# Patient Record
Sex: Male | Born: 1990
Health system: Southern US, Community
[De-identification: ages and names within clinical notes are randomized; demographics above are authoritative.]

## PROBLEM LIST (undated history)

## (undated) DIAGNOSIS — R569 Unspecified convulsions: Secondary | ICD-10-CM

## (undated) MED FILL — PHENYTOIN SODIUM EXTENDED 100 MG CAPSULE: 100 100 mg | ORAL | 30 days supply | Qty: 150 | Fill #4 | Status: CN

## (undated) MED FILL — LACOSAMIDE 150 MG TABLET: 150 150 mg | ORAL | 30 days supply | Qty: 60 | Fill #0 | Status: CN

## (undated) MED FILL — LACOSAMIDE 100 MG TABLET: 100 100 mg | ORAL | Qty: 120 | Fill #0 | Status: CN

## (undated) MED FILL — LACOSAMIDE 150 MG TABLET: 150 150 mg | ORAL | 90 days supply | Qty: 180 | Fill #0 | Status: CN

## (undated) MED FILL — LACOSAMIDE 150 MG TABLET: 150 150 mg | ORAL | 90 days supply | Qty: 180 | Fill #1 | Status: CN

---
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
## 2011-05-09 ENCOUNTER — Encounter: Payer: Self-pay | Admitting: *Deleted

## 2011-05-09 ENCOUNTER — Emergency Department (HOSPITAL_COMMUNITY): Payer: 59

## 2011-05-09 ENCOUNTER — Emergency Department (HOSPITAL_COMMUNITY)
Admission: EM | Admit: 2011-05-09 | Discharge: 2011-05-09 | Disposition: A | Discharge status: Home / Self Care | Payer: 59 | Attending: Emergency Medicine | Admitting: Emergency Medicine

## 2011-05-09 DIAGNOSIS — R509 Fever, unspecified: Secondary | ICD-10-CM | POA: Insufficient documentation

## 2011-05-09 DIAGNOSIS — R112 Nausea with vomiting, unspecified: Secondary | ICD-10-CM | POA: Insufficient documentation

## 2011-05-09 DIAGNOSIS — R05 Cough: Secondary | ICD-10-CM | POA: Insufficient documentation

## 2011-05-09 DIAGNOSIS — R059 Cough, unspecified: Secondary | ICD-10-CM | POA: Insufficient documentation

## 2011-05-09 DIAGNOSIS — R5381 Other malaise: Secondary | ICD-10-CM | POA: Insufficient documentation

## 2011-05-09 DIAGNOSIS — IMO0001 Reserved for inherently not codable concepts without codable children: Secondary | ICD-10-CM | POA: Insufficient documentation

## 2011-05-09 DIAGNOSIS — R070 Pain in throat: Secondary | ICD-10-CM | POA: Insufficient documentation

## 2011-05-09 HISTORY — DX: Unspecified convulsions: R56.9

## 2011-05-09 LAB — URINALYSIS, ROUTINE W REFLEX MICROSCOPIC
Glucose, UA: NEGATIVE mg/dL
Protein, ur: 30 mg/dL — AB
Specific Gravity, Urine: 1.027 (ref 1.005–1.030)
pH: 7 (ref 5.0–8.0)

## 2011-05-09 LAB — URINE MICROSCOPIC-ADD ON

## 2011-05-09 LAB — CBC
Hemoglobin: 14.5 g/dL (ref 13.0–17.0)
MCH: 30.8 pg (ref 26.0–34.0)
RBC: 4.71 MIL/uL (ref 4.22–5.81)

## 2011-05-09 LAB — MONONUCLEOSIS SCREEN: Mono Screen: NEGATIVE

## 2011-05-09 LAB — DIFFERENTIAL
Eosinophils Absolute: 0 10*3/uL (ref 0.0–0.7)
Lymphocytes Relative: 10 % — ABNORMAL LOW (ref 12–46)
Lymphs Abs: 1.2 10*3/uL (ref 0.7–4.0)
Monocytes Relative: 4 % (ref 3–12)
Neutrophils Relative %: 86 % — ABNORMAL HIGH (ref 43–77)

## 2011-05-09 LAB — POCT I-STAT, CHEM 8
BUN: 16 mg/dL (ref 6–23)
Creatinine, Ser: 1 mg/dL (ref 0.50–1.35)
Glucose, Bld: 100 mg/dL — ABNORMAL HIGH (ref 70–99)
Hemoglobin: 13.3 g/dL (ref 13.0–17.0)
TCO2: 24 mmol/L (ref 0–100)

## 2011-05-09 MED ORDER — ONDANSETRON HCL 4 MG/2ML IJ SOLN
4.0000 mg | Freq: Once | INTRAMUSCULAR | Status: AC
  Administered 2011-05-09: 4 mg via INTRAVENOUS
  Filled 2011-05-09: qty 2

## 2011-05-09 MED ORDER — SODIUM CHLORIDE 0.9 % IV BOLUS (SEPSIS)
1000.0000 mL | Freq: Once | INTRAVENOUS | Status: AC
  Administered 2011-05-09: 1000 mL via INTRAVENOUS

## 2011-05-09 NOTE — ED Notes (Signed)
Patient presents to ed via [facility] states he came home from college last pm was c/o sorethroat with fever and chills, Mother called the MD and was told to give patient Motrin 800 mg patient went to derm. MD today was feeling worsel, went to PEDs office and was having chills and shaking. Strep was doen at MD office and was neg.

## 2011-05-09 NOTE — ED Provider Notes (Signed)
History     CSN: [id number] Arrival date & time: 05/09/2011 12:37 PM   First MD Initiated Contact with Patient 05/09/11 1242      Chief Complaint  Patient presents with  . Chills    (Consider location/radiation/quality/duration/timing/severity/associated sxs/prior treatment) Patient is a 20 y.o. male presenting with fever. The history is provided by the patient.  Fever Primary symptoms of the febrile illness include fever, fatigue, cough, nausea, vomiting and myalgias. Primary symptoms do not include shortness of breath, diarrhea or rash. The current episode started yesterday. This is a new problem. The problem has been gradually worsening.    Past Medical History  Diagnosis Date  . Seizures     History reviewed. No pertinent past surgical history.  History reviewed. No pertinent family history.  History  Substance Use Topics  . Smoking status: Never Smoker   . Smokeless tobacco: Not on file  . Alcohol Use: No      Review of Systems  Constitutional: Positive for fever and fatigue. Negative for chills.  HENT: Positive for sore throat.   Eyes: Negative.   Respiratory: Positive for cough. Negative for shortness of breath.   Cardiovascular: Negative.   Gastrointestinal: Positive for nausea and vomiting. Negative for diarrhea.  Musculoskeletal: Positive for myalgias.  Skin: Negative.  Negative for rash.  Neurological: Negative.     Allergies  Amoxicillin  Home Medications   Current Outpatient Rx  Name Route Sig Dispense Refill  . DIVALPROEX SODIUM 500 MG PO TB24 Oral Take 500 mg by mouth daily.      Marland Kitchen DIVALPROEX SODIUM 125 MG PO CPSP Oral Take 125 mg by mouth 2 (two) times daily.      Marland Kitchen LAMOTRIGINE 25 MG PO TABS Oral Take 25 mg by mouth daily.        BP 110/66  Pulse 114  Temp(Src) 98.2 F (36.8 C) (Oral)  Resp 16  SpO2 100%  Physical Exam  Constitutional: He appears well-developed and well-nourished.  HENT:  Head: Normocephalic.  Right Ear:  External ear normal.  Left Ear: External ear normal.  Mouth/Throat: No uvula swelling. Posterior oropharyngeal erythema present. No posterior oropharyngeal edema or tonsillar abscesses.  Neck: Normal range of motion. Neck supple.  Cardiovascular: Normal rate and regular rhythm.   Pulmonary/Chest: Effort normal and breath sounds normal.  Abdominal: Soft. Bowel sounds are normal. There is no splenomegaly. There is no tenderness. There is no rebound and no guarding.  Musculoskeletal: Normal range of motion.  Lymphadenopathy:       Head (right side): No occipital adenopathy present.       Head (left side): No occipital adenopathy present.    He has no cervical adenopathy.  Neurological: He is alert. No cranial nerve deficit.  Skin: Skin is warm and dry. No rash noted.  Psychiatric: He has a normal mood and affect.    ED Course  Procedures (including critical care time)   Labs Reviewed  CBC  DIFFERENTIAL  I-STAT, CHEM 8  URINALYSIS, ROUTINE W REFLEX MICROSCOPIC  MONONUCLEOSIS SCREEN   No results found.   No diagnosis found.    MDM  Discussed lab/x-ray studies with patient and family, as well as value of LP. Decided against LP - patient feeling improved but symptoms not completely resolved. Drinking PO fluids. Will discharge home and encourage follow up prior to returning to school on Monday.        Rodena Medin, PA 05/09/11 1537

## 2011-05-09 NOTE — ED Provider Notes (Addendum)
Medical screening examination/treatment/procedure(s) were conducted as a shared visit with non-physician practitioner(s) and myself.  I personally evaluated the patient during the encounter  20 y/o male, home from college, developed body aches, fever, sore throat yesterday.  +exposure to mono.  At derm office today developed lightheadedness, nausea, shaking chills, rigors. Patient awake during shaking and recalls entire episode.  Not consistent with known seizure disorder. Referred here.  Feeling much better now and states just "weak".  No headache, vision change, cough, chest pain, abdominal pain.   TMs clear, oropharynx erythematous, no asymmetry (strep neg at office), CTAB, RRR. No cervical LAD, no meningismus.  Normal neuro exam. Tolerating PO and ambulatory in ED.  Likelihood of viral infection discussed with patient and family.  Possibility of viral meningitis discussed extensively with patient and parents.  Offered LP for confirmation but would not change management.  Patient looks much too well for bacterial meningitis.  Patient and parents declined LP.  Will continue supportive care for viral syndrome.  Followup at [facility] this weekend discussed as well as signs and symptoms that should prompt return to ED.  Glynn Octave, MD 05/09/11 1944  Glynn Octave, MD 05/09/11 1945

## 2011-12-05 ENCOUNTER — Other Ambulatory Visit: Payer: Self-pay

## 2011-12-05 ENCOUNTER — Telehealth: Payer: Self-pay

## 2011-12-05 DIAGNOSIS — K625 Hemorrhage of anus and rectum: Secondary | ICD-10-CM

## 2011-12-05 NOTE — Telephone Encounter (Signed)
Todd Young, Can you call Todd Young (Todd Young's wife). Their son, Todd Young who is 59 had some rectal bleeding and needs a flex sig this week (leaving for trip next Monday). Add him on to Thursday at [facility] (moderate sedation) or Friday lunch case at [facility] (moderate sedation), whichever works best for them. Thanks Her phone number is [phone]   Pt's mother has been notified and meds reviewed she was given the prep instructions and the phone number to Dr Christella Hartigan office for any questions.

## 2011-12-06 ENCOUNTER — Encounter (HOSPITAL_COMMUNITY)
Admission: RE | Disposition: A | Discharge status: Home / Self Care | Payer: Self-pay | Source: Ambulatory Visit | Attending: Gastroenterology

## 2011-12-06 ENCOUNTER — Ambulatory Visit (HOSPITAL_COMMUNITY)
Admission: RE | Admit: 2011-12-06 | Discharge: 2011-12-06 | Disposition: A | Discharge status: Home / Self Care | Payer: 59 | Source: Ambulatory Visit | Attending: Gastroenterology | Admitting: Gastroenterology

## 2011-12-06 ENCOUNTER — Encounter (HOSPITAL_COMMUNITY): Payer: Self-pay

## 2011-12-06 DIAGNOSIS — K625 Hemorrhage of anus and rectum: Secondary | ICD-10-CM

## 2011-12-06 DIAGNOSIS — K644 Residual hemorrhoidal skin tags: Secondary | ICD-10-CM | POA: Insufficient documentation

## 2011-12-06 DIAGNOSIS — K649 Unspecified hemorrhoids: Secondary | ICD-10-CM

## 2011-12-06 HISTORY — PX: FLEXIBLE SIGMOIDOSCOPY: SHX5431

## 2011-12-06 SURGERY — SIGMOIDOSCOPY, FLEXIBLE
Anesthesia: Moderate Sedation

## 2011-12-06 MED ORDER — MIDAZOLAM HCL 10 MG/2ML IJ SOLN
INTRAMUSCULAR | Status: AC
  Filled 2011-12-06: qty 2

## 2011-12-06 MED ORDER — FENTANYL CITRATE 0.05 MG/ML IJ SOLN
INTRAMUSCULAR | Status: AC
  Filled 2011-12-06: qty 2

## 2011-12-06 MED ORDER — SODIUM CHLORIDE 0.9 % IV SOLN
INTRAVENOUS | Status: DC
  Administered 2011-12-06: 500 mL via INTRAVENOUS

## 2011-12-06 NOTE — Op Note (Signed)
[facility] [address] [place], [place]  [address]  FLEXIBLE SIGMOIDOSCOPY PROCEDURE REPORT  PATIENT:  Todd, Young  MR#:  [id number] BIRTHDATE:  [birth year], 20 yrs. old  GENDER:  male ENDOSCOPIST:  Rachael Fee, MD PROCEDURE DATE:  12/06/2011 PROCEDURE:  Flexible Sigmoidoscopy, diagnostic ASA CLASS:  Class II INDICATIONS:  intermittent rectal bleeding MEDICATIONS:   none  DESCRIPTION OF PROCEDURE:   After the risks benefits and alternatives of the procedure were thoroughly explained, informed consent was obtained.  Digital rectal exam was performed and revealed no rectal masses.   The EC - 3890Li ([id number]) endoscope was introduced through the anus and advanced to the splenic flexure, without limitations.  The quality of the prep was good. The instrument was then slowly withdrawn as the mucosa was fully examined. <<PROCEDUREIMAGES>> Small external hemorrhoids were found.  The examination was otherwise normal (see image1, image4, and image5).   Retroflexed views in the rectum revealed no abnormalities.    The scope was then withdrawn from the patient and the procedure terminated.COMPLICATIONS:  None  ENDOSCOPIC IMPRESSION: 1) Small external hemorrhoids 2) Otherwise normal examination (no cancers, polyps or inflamation)  RECOMMENDATIONS: Try fiber supplement (once daily citrucel orange powder). Use OTC preparation H for intermittent bleeding or any anal discomforts.  ______________________________ Rachael Fee, MD  n. eSIGNED:   Rachael Fee at 12/06/2011 01:24 PM  Thornton Papas, [phone]

## 2011-12-06 NOTE — H&P (Signed)
  HPI: This is a very pleasant young man with intermittent rectal bleeding for past few months.  NO real anal discomfort, no signficant constipation or diarrhea    Past Medical History  Diagnosis Date  . Seizures     History reviewed. No pertinent past surgical history.  No current facility-administered medications for this encounter.    Allergies as of 12/05/2011 - Review Complete 05/09/2011  Allergen Reaction Noted  . Amoxicillin Other (See Comments) 05/09/2011    History reviewed. No pertinent family history.  History   Social History  . Marital Status: Single    Spouse Name: N/A    Number of Children: N/A  . Years of Education: N/A   Occupational History  . Not on file.   Social History Main Topics  . Smoking status: Never Smoker   . Smokeless tobacco: Not on file  . Alcohol Use: No  . Drug Use: No  . Sexually Active:    Other Topics Concern  . Not on file   Social History Narrative  . No narrative on file      Physical Exam: There were no vitals taken for this visit. Constitutional: generally well-appearing Psychiatric: alert and oriented x3 Abdomen: soft, nontender, nondistended, no obvious ascites, no peritoneal signs, normal bowel sounds     Assessment and plan: 21 y.o. male with intermittent rectal bleeding  For flex sig today

## 2011-12-06 NOTE — Discharge Instructions (Signed)

## 2011-12-07 ENCOUNTER — Encounter (HOSPITAL_COMMUNITY): Payer: Self-pay | Admitting: Gastroenterology

## 2012-01-03 ENCOUNTER — Other Ambulatory Visit (HOSPITAL_COMMUNITY): Payer: Self-pay | Admitting: Pediatrics

## 2012-01-03 DIAGNOSIS — R569 Unspecified convulsions: Secondary | ICD-10-CM

## 2012-01-17 ENCOUNTER — Ambulatory Visit (HOSPITAL_COMMUNITY)
Admission: RE | Admit: 2012-01-17 | Discharge: 2012-01-17 | Disposition: A | Discharge status: Home / Self Care | Payer: 59 | Source: Ambulatory Visit | Attending: Pediatrics | Admitting: Pediatrics

## 2012-01-17 DIAGNOSIS — R569 Unspecified convulsions: Secondary | ICD-10-CM | POA: Insufficient documentation

## 2012-01-17 DIAGNOSIS — Z1389 Encounter for screening for other disorder: Secondary | ICD-10-CM | POA: Insufficient documentation

## 2012-01-17 NOTE — Progress Notes (Signed)
EEG COMPLETED

## 2012-01-19 NOTE — Procedures (Signed)
EEG NUMBER:  [id number].  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  There was no loss of consciousness, unresponsiveness or other seizure activity (345.10, 345.00, 781.0)  PROCEDURE:  The tracing was carried out on a 32 channel digital Cadwell recorder, reformatted into 16 channel montages with 1 devoted to EKG. The patient was awake and asleep during the recording.  The international 10/20 system lead placement was used.  He takes Lamictal and Depakote.  RECORDING TIME:  22 minutes.  DESCRIPTION OF FINDINGS:  Dominant frequency is a 10 Hz, 50 microvolt activity that is well regulated.  Background activity consists of predominantly alpha and beta range activity.  The patient becomes drowsy with 20 microvolt theta and upper delta range activity and drifts into natural sleep with generalized delta range activity vertex sharp waves and symmetric sleep spindles.  Activating procedures with photic stimulation induced driving response between 6 and 18 Hz.  Hyperventilation was unchanged.  EKG showed regular sinus rhythm with ventricular response of 72 beats per minute.  IMPRESSION:  This is a normal record with the patient awake, drowsy, and asleep.     Deanna Artis. Sharene Skeans, M.D.    [id number]:OZHY D:  01/19/2012 07:45:25  T:  01/19/2012 07:55:16  Job #:  [id number]

## 2012-09-30 ENCOUNTER — Telehealth: Payer: Self-pay | Admitting: Family

## 2012-09-30 NOTE — Telephone Encounter (Signed)
9 one half minute  phone call with mother.  I strongly suspect that it is the difficulty the courses and not the patient to have changed.  That being said I can't rule out the effects of Depakote, but there isn't a good alternative.  A suggested that we consider a detailed psychometric testing called [facility] with Dr. Nicole Cella call .  She will contact him and let me know whether we are going to proceed.

## 2012-09-30 NOTE — Telephone Encounter (Signed)
Mom [name] is concerned about Todd Young. She said that he has difficult classes in college and has a Engineer, technical sales but that he is really struggling with memory and retention of material. He has problems with that and attention in general. Mom wonders if Depakote causing cognitive blunting. He is working with a Engineer, technical sales. He understands things when working with a Engineer, technical sales and understands things but doesn't remember things after he leaves the tutor. Mom wonders is it the Depakote? Is there another type of specialist that he should talk to about this or something else he should do? She wonders if it is a side effect of Depakote or problems that Todd Young is having. She would like to talk to Dr Sharene Skeans and asked to be called back at [phone].

## 2012-10-10 ENCOUNTER — Telehealth: Payer: Self-pay | Admitting: *Deleted

## 2012-10-10 NOTE — Telephone Encounter (Deleted)
[name] called back stating she was at work and would try again at 2pm when she is on break.

## 2012-10-10 NOTE — Telephone Encounter (Addendum)
Angel left voicemail stating two nights ago Todd Young was having the "eye thing" again.  He was laying the dark and could see the light in the hall.  This made him so dizzy he would not lay with his eyes closed therefore he had trouble going to sleep.  The next day (yesterday) he said he felt out of it all day.  He also stated he had 3 episodes of what he described as  hypoglycemia. He was very shaky.  He did not eat breakfast but did eat lunch and again after that.  She can be reached at [phone] before 7pm.   I spoke with mother for about 5 minutes and the patient for about 20 minutes.  I think that these may represent some form of migraine variant. I don't think he is having hypoglycemia but I told him that if he would keep snacks that were low in sugar and higher in protein and fat that it might work better for him.  He can certainly be tested for reactive hypoglycemia.  He had his episodes of feeling shaky the day after his oscillatory movements of the eyes and at a time when he was not feeling well.  For the first time he describes a feeling of vertigo when he closed his eyes.  I told him I would be happy to see him after he finishes finals.

## 2012-10-24 ENCOUNTER — Telehealth: Payer: Self-pay | Admitting: Family

## 2012-10-24 NOTE — Telephone Encounter (Signed)
Mom Todd Young called. She really feels that he needs something for anxiety. She thinks that it is interfering with his ability at school. She wants to know if an anxiety medication would interfere with his seizure control or his seizure medication. Mom wants to talk to you about it. She said that she had talked to Todd Young and says that Todd Young is going in to see him but Mom wants to know if an antianxiety medication will cause problems with his seizures. Mom knows that you are reluctant for him to take anxiety medication but she feels strongly that he needs something. Mom wants you to call her to discuss it. Mom's number is [phone]. TG

## 2012-10-27 NOTE — Telephone Encounter (Signed)
Spoke with mother I agree that he may be anxious.  Please find a time when we might be able to spend a half an hour to discuss his condition.  He is back at school for 2 semesters this summer.

## 2012-10-29 NOTE — Telephone Encounter (Signed)
I spoke with patient's mom and we have scheduled the patient to see Dr. Sharene Skeans on 11/20/12 @ 12:00 pm arriving at 11:45 am. MB

## 2012-11-05 DIAGNOSIS — R259 Unspecified abnormal involuntary movements: Secondary | ICD-10-CM | POA: Insufficient documentation

## 2012-11-05 DIAGNOSIS — H5310 Unspecified subjective visual disturbances: Secondary | ICD-10-CM

## 2012-11-05 DIAGNOSIS — Z79899 Other long term (current) drug therapy: Secondary | ICD-10-CM

## 2012-11-05 DIAGNOSIS — R209 Unspecified disturbances of skin sensation: Secondary | ICD-10-CM

## 2012-11-05 DIAGNOSIS — R42 Dizziness and giddiness: Secondary | ICD-10-CM | POA: Insufficient documentation

## 2012-11-05 DIAGNOSIS — G40309 Generalized idiopathic epilepsy and epileptic syndromes, not intractable, without status epilepticus: Secondary | ICD-10-CM | POA: Insufficient documentation

## 2012-11-05 DIAGNOSIS — G40209 Localization-related (focal) (partial) symptomatic epilepsy and epileptic syndromes with complex partial seizures, not intractable, without status epilepticus: Secondary | ICD-10-CM | POA: Insufficient documentation

## 2012-11-11 ENCOUNTER — Other Ambulatory Visit: Payer: Self-pay

## 2012-11-11 DIAGNOSIS — G40309 Generalized idiopathic epilepsy and epileptic syndromes, not intractable, without status epilepticus: Secondary | ICD-10-CM

## 2012-11-11 DIAGNOSIS — G40209 Localization-related (focal) (partial) symptomatic epilepsy and epileptic syndromes with complex partial seizures, not intractable, without status epilepticus: Secondary | ICD-10-CM

## 2012-11-11 MED ORDER — DEPAKOTE ER 500 MG PO TB24: ORAL_TABLET | ORAL | Status: DC

## 2012-11-11 NOTE — Telephone Encounter (Signed)
Pharmacy needs updated Rx for use of copay reduction card.

## 2012-11-20 ENCOUNTER — Ambulatory Visit: Payer: Self-pay | Admitting: Pediatrics

## 2013-01-08 ENCOUNTER — Telehealth: Payer: Self-pay | Admitting: Family

## 2013-01-08 NOTE — Telephone Encounter (Signed)
I spoke with Todd Young and she said that Aug. 4th at 4:00 pm arriving at 3:50 works great. MB

## 2013-01-08 NOTE — Telephone Encounter (Signed)
My nighttime commitment to [facility] medical [facility] quality committee has been canceled.  We just opened a 4 PM on August 4.  We will schedule Todd Young at that time.  I informed Marcelino Duster to contact the family and offer this slot.

## 2013-01-08 NOTE — Telephone Encounter (Signed)
Mom, Todd Young, called and said that Todd Young ended up withdrawing from second session of summer school. He is having lots of problems and is really struggling. They are going to [place] tomorrow and coming back Aug 2nd. Mom asked if you could you see him any time between Aug 3rd and Aug 14th. . Right now you have no open appointments week of Aug 4-8 and you are out of the office week of Aug 11th. TG

## 2013-01-19 ENCOUNTER — Ambulatory Visit (INDEPENDENT_AMBULATORY_CARE_PROVIDER_SITE_OTHER): Payer: 59 | Admitting: Pediatrics

## 2013-01-19 ENCOUNTER — Encounter: Payer: Self-pay | Admitting: Pediatrics

## 2013-01-19 VITALS — BP 110/64 | HR 84 | Ht 73.5 in | Wt 180.6 lb

## 2013-01-19 DIAGNOSIS — G40309 Generalized idiopathic epilepsy and epileptic syndromes, not intractable, without status epilepticus: Secondary | ICD-10-CM

## 2013-01-19 DIAGNOSIS — H5310 Unspecified subjective visual disturbances: Secondary | ICD-10-CM

## 2013-01-19 DIAGNOSIS — R259 Unspecified abnormal involuntary movements: Secondary | ICD-10-CM

## 2013-01-19 NOTE — Progress Notes (Signed)
Patient: Todd Young MRN: [id number] Sex: male DOB: [birth year]  Provider: Deetta Perla, MD Location of Care: [facility]  Note type: Routine return visit  History of Present Illness: Referral Source: Dr. Rosanne Ashing History from: patient and [facility] chart Chief Complaint: Struggling in School/Anxiety Issues  Todd Young is a 22 y.o. male referred for evaluation of epilepsy.  He returns January 19, 2013, for the first time since June 02, 2012.  He has a long history of absence and generalized tonic-clonic seizures.  At one point, it appeared that he had complex partial seizures.  As best I can determine, he is not experiencing seizures at this time.  He has episodes were he senses the world oscillating back and forth without any alterations in awareness of his world or loss of his ability to function.  He is not experiencing vertigo during this time.  His father has observed this behavior and has not seen nystagmus or any other eye movements.  The episodes last for seconds to 15 minutes.  He believes that they occur at least once a week, which makes them more frequent than they ever have been.  He has had some episodes of fasciculations in his muscles including his quadriceps, hamstrings, biceps, and neck.  In all likelihood this is a benign condition.  His main concern is that he is not performing in school as well as he has in the past.  He is in a premed program and received a B minus in organic chemistry and a B in analytic chemistry despite working very hard.  I asked if he sought the assistance of tutors and he said "yeah I caved in."  This happened too late in the term to make a difference.  I talked to him about the utility of tutors in terms of helping him understand concepts that are difficult and directing his study.  I see this as a way to improve his performance in school and not as a sign of weakness.  He has modifications at school, which would allow him  50% more time to take tests.  He also has a Engineer, technical sales in the learning center at [employer] that can help him with study techniques and unfortunately he did not take advantage of that until late in the school year.  He is extremely stressed by school.  He has very high expectations for himself and I am certain that he worries about disappointing his parents as well as himself.  He thinks the stress propels him to study hard, but he has enough insight to realize that his anxiety may inhibit his ability to study well.  He had IQ testing a number of years ago, which established a superior IQ with processing speed index in the average range.  He particularly had difficulty completing mental tasks under time pressure.  He again did very well on his achievement tests with the exception of tests that were done under time pressure.  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  Concerns were raised about an anxiety disorder.  Testing was carried out in November and early December 2010.  The central question today is whether or not his anxiety is significant enough that he should be placed on anxiolytics.  Review of Systems: 12 system review was remarkable for anxiety  Past Medical History  Diagnosis Date  . Seizures    Hospitalizations:  no, Head Injury: no, Nervous System Infections: no, Immunizations up to date: yes Past Medical History Comments:   He had onset of seizures when he was 14. These were absence. Keppra worked well and brought the episodes under control. Initially, he had problems with temper, the Keppra well controlled his seizures and it was continued until his doses were escalated to 3000 mg a day at which time Lamictal was started.  He had an EEG in October 2009 that showed bifrontal sharp waves. He an MRI scan in January 2010 that was normal without with contrast.  Patient was seen  in the ER Dept. at [facility] for high fever in Nov. 2012.  He started to have generalized tonic-clonic seizures and other episodes associated with unresponsive staring that suggested complex partial seizures.  I discontinued Keppra and continued Lamictal monotherapy. Unfortunately, he had issues with cognition and continued seizures.  Finally I tapered Lamictal and started Depakote, and he has done extremely well on low dose Lamictal and average doses of Depakote.  He has experienced episodes of sensing the world oscillating back and forth without any other alterations in awareness of the world or his ability to function.  This typically happens when he has been looking in a computer for a long time or a movie.   These have been evaluated in detail, and I can find no evidence of seizure activity.  Unfortunately, they are typically infrequent enough that we have not been able to study them with a prolonged video EEG that would be definitive.  I am unaware of any seizure-like behavior of it that could cause this form of oscillopsia.  I think it is more likely that this represents a migraine variant.  Even with that, I have no personal experience with that symptom as a manifestation of migraine variant.  When the patient was in [place], [place] in the summer of 2013, he had an episode of paraesthesia in his right arm that began in the forearm and spread to his fingers.  He was unable to shake his arm to reestablish normal sensation.  Shortly after that began, he had twitching of his thumb and index finger that lasted intermittently over 10 minutes.  This did not persist, although the numbness did persist for about an hour.  The patient had been sleep deprived as I expected would occur because of meals that began at 10 p.m. and nights that often lasted until 2 or 3 in the morning.  I strongly encouraged him to get enough sleep, although again I suspected that the prolonged nature of his sensory symptoms  made this more likely that he had a migraine variant than a seizure.  In early September 2013 and again April 16, 2012, the patient had irregular twitching of his middle finger.  He was able to capture this on video, which I reviewed.  I was absolutely certain that this did not represent a focal motor seizure and conveyed that both to his mother and to [place].  He had an episode of high fever and shaking chills of unknown etiology.  He recovered spontaneously.  Birth History 8 lbs. infant born at [redacted] weeks gestational age to a 21 year old primigravida Gestation was uncomplicated Mother received  normal spontaneous vaginal delivery Nursery Course was uncomplicated Growth and Development was recalled as  normal  Behavior History none  Surgical History Past Surgical History  Procedure Laterality Date  . Wisdom tooth extraction  2011    x4   . Flexible sigmoidoscopy  12/06/2011    Procedure: FLEXIBLE SIGMOIDOSCOPY;  Surgeon: Rachael Fee, MD;  Location: [facility];  Service: Endoscopy;  Laterality: N/A;   Family History family history is not on file.  Father had episodes of confusion following exercise that were evaluated at [facility], workup was negative.  There is a strong family history of migraine with aura in father, paternal aunt, paternal grandmother, paternal first cousin, and his sister.  Family History is negative migraines, seizures, cognitive impairment, blindness, deafness, birth defects, chromosomal disorder, autism.  Social History History   Social History  . Marital Status: Single    Spouse Name: N/A    Number of Children: N/A  . Years of Education: N/A   Social History Main Topics  . Smoking status: Never Smoker   . Smokeless tobacco: Never Used  . Alcohol Use: No  . Drug Use: No  . Sexually Active: None   Other Topics Concern  . None   Social History Narrative  . None   Educational level university School Attending: [employer].  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  Current Outpatient Prescriptions on File Prior to Visit  Medication Sig Dispense Refill  . DEPAKOTE ER 500 MG 24 hr tablet Take 1 tab by mouth twice daily  180 tablet  0  . divalproex (DEPAKOTE SPRINKLE) 125 MG capsule Take 125 mg by mouth 2 (two) times daily.       Marland Kitchen lamoTRIgine (LAMICTAL) 25 MG tablet Take 25 mg by mouth daily.        Marland Kitchen acetaminophen (TYLENOL) 500 MG tablet Take 500 mg by mouth every 6 (six) hours as needed. Fever/pain       . ibuprofen (ADVIL,MOTRIN) 200 MG tablet Take 400 mg by mouth every 6 (six) hours as needed. Fever/pain       . ISOtretinoin (ACCUTANE) 30 MG capsule Take 30 mg by mouth 2 (two) times daily.         No current facility-administered medications on file prior to visit.   The medication list was reviewed and reconciled. All changes or newly prescribed medications were explained.  A complete medication list was provided to the patient/caregiver.  Allergies  Allergen Reactions  . Amoxicillin Other (See Comments)    Mouth inflamed and itchy    Physical Exam BP 110/64  Pulse 84  Ht 6' 1.5" (1.867 m)  Wt 180 lb 9.6 oz (81.92 kg)  BMI 23.5 kg/m2  General: alert, well developed, well nourished young man, in no acute distress, left-handed, sandy hair, blue eyes Head: normocephalic, no dysmorphic features Ears, Nose and Throat: Otoscopic: tympanic membranes normal .  Pharynx: oropharynx is pink without exudates or tonsillar hypertrophy. Neck: supple, full range of motion, no cranial or cervical bruits Respiratory: auscultation clear Cardiovascular: no murmurs, pulses are normal Musculoskeletal: no skeletal deformities or apparent scoliosis Skin: no rashes or neurocutaneous lesions Trunk:  no deformities of his  limbs  Neurologic Exam  Mental Status: alert; oriented to person, place, and year; knowledge is normal for age; language is normal Cranial Nerves: visual fields are full to double simultaneous stimuli; extraocular movements are full and conjugate; pupils are round reactive to light; funduscopic examination shows sharp disc margins with normal vessels; symmetric facial strength; midline tongue and uvula; hearing is normal and symmetric Motor: Normal  strength, tone, and mass; good fine motor movements; no pronator drift. Sensory: intact responses to touch and temperature Coordination: good finger-to-nose, rapid repetitive alternating movements and finger apposition   Gait and Station: normal gait and station; patient is able to walk on heels, toes and tandem without difficulty; balance is adequate; Romberg exam is negative; Gower response is negative Reflexes: symmetric and diminished bilaterally; no clonus; bilateral flexor plantar responses.  Assessment 1. 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 2. Generalized convulsive epilepsy, well controlled. 3. Subjective visual disturbance 368.10.  I am uncertain what this is. 4. He has not experienced any abnormal involuntary movements or altered sensation in some time.  Discussion Todd Young may very well have a general anxiety disorder.  He places stress on himself to perform and consequently is quite upset when he does not perform to the best of his ability.  I talked with him about reaching out to the supports that he has at school early in the semester and trying to stay up with his studies and not stretch deadlines.  This cost him this summer when he did not complete some problems sets on time and received low grades for those despite the fact he did extremely well on his tests and would have received an A otherwise.  I also suggested that he focus on trying to master the concepts he is learning in school, because if he does that, I  think the grades will take care of themselves.  Again, I strongly urged him to reach out to tutors teaching assistant that are readily available to him, but he has avoided more out of a feeling that he is giving in and should be able to deal with his academic issues without assistance.  Plan I am going to withhold the use of an anxiolytic medication until we see how the school year starts.  I have had him seen to evaluate his visual disturbance.  The neuro-ophthalmologist did not have ideas for further workup, let alone on treatment.  I appreciate the opportunity to participate in his care.  He will return in four months for reassessment.  I will see him sooner if we need to consider placing him on additional medication.  I would like to avoid that, because I do not want to affect his cognitive function with additional medicine that affects his nervous system.  Nonetheless, if his anxiety proves to be something that inhibits learning, this will be necessary.  Deetta Perla MD

## 2013-01-19 NOTE — Patient Instructions (Signed)
It was good to see you.  Let see how the semester begins.  Please don't failed to reach out for help if you needed either from me or from your teachers.let me know if you need refills and your prescriptions.  Extremities are imaging time to get adequate sleep as many days as she can each week.  Repair for your side effects to master them rather than to get a grade, and I think things will go better for you.  I'll see you in December or sooner if needed.

## 2013-01-29 ENCOUNTER — Other Ambulatory Visit: Payer: Self-pay | Admitting: Family

## 2013-01-29 DIAGNOSIS — G40209 Localization-related (focal) (partial) symptomatic epilepsy and epileptic syndromes with complex partial seizures, not intractable, without status epilepticus: Secondary | ICD-10-CM

## 2013-01-29 DIAGNOSIS — G40309 Generalized idiopathic epilepsy and epileptic syndromes, not intractable, without status epilepticus: Secondary | ICD-10-CM

## 2013-04-15 ENCOUNTER — Other Ambulatory Visit: Payer: Self-pay | Admitting: Family

## 2013-06-01 ENCOUNTER — Ambulatory Visit (INDEPENDENT_AMBULATORY_CARE_PROVIDER_SITE_OTHER): Payer: 59 | Admitting: Pediatrics

## 2013-06-01 ENCOUNTER — Encounter: Payer: Self-pay | Admitting: Pediatrics

## 2013-06-01 VITALS — BP 100/60 | HR 84 | Ht 73.75 in | Wt 183.0 lb

## 2013-06-01 DIAGNOSIS — G40309 Generalized idiopathic epilepsy and epileptic syndromes, not intractable, without status epilepticus: Secondary | ICD-10-CM

## 2013-06-01 DIAGNOSIS — H5310 Unspecified subjective visual disturbances: Secondary | ICD-10-CM

## 2013-06-01 NOTE — Progress Notes (Signed)
Patient: Todd Young MRN: [id number] Sex: male DOB: [birth year]  Provider: Deetta Perla, MD Location of Care: [facility]  Note type: Routine return visit  History of Present Illness: Referral Source: Dr. Rosanne Ashing History from: patient and [facility] chart Chief Complaint: Epilepsy  Todd Young is a 22 y.o. male who returns for evaluation and management of epilepsy.  He returns on June 01, 2013 for the first time since January 19, 2013.  He has a well-controlled seizure disorder that includes absence and generalized tonic-clonic seizures.  At one point it appeared that he also had complex partial seizures.  The patient continues to have infrequent episodes of the sensation that the world was oscillating back and forth without alterations in awareness of his world or loss of his ability to function.  This typically occurs when he has been sleep-deprived or is under stress.  The episodes have not been frequent or prolonged.  He is in his junior year at [employer].  He had a pretty good term.  The only course that was of concern to him was B minus in genetics.  He is getting out more, spends sometime everyday working out, and he was taking breaks from his studies.  He wants to go to medical school.  He is beginning to think about taking some time off before applying to medical school.  I think that for his mental health that that might actually be a good plan.  The average age of the entering classes is getting older as students pursue other activities before they attend medical school.  He has been so anxious about his school performance in what he expects of himself and what he thinks his parents expect of him that this has the potential to become overwhelming.  We did not talk about using anxiolytics today.  He seems to be in a better place emotionally and mentally, which I was happy to see.  His overall health has been good.  He is taking and tolerating his  antiepileptic medications without side effects.  Review of Systems: 12 system review was unremarkable  Past Medical History  Diagnosis Date  . Seizures    Hospitalizations: no, Head Injury: no, Nervous System Infections: no, Immunizations up to date: yes Past Medical History Comments: He had onset of seizures when he was 14. These were absence. Keppra worked well and brought the episodes under control. Initially, he had problems with temper, the Keppra well controlled his seizures and it was continued until his doses were escalated to 3000 mg a day at which time Lamictal was started.   He had an EEG in October 2009 that showed bifrontal sharp waves. He an MRI scan in January 2010 that was normal without with contrast.   Patient was seen in the ER Dept. at [facility] for high fever in Nov. 2012.   He started to have generalized tonic-clonic seizures and other episodes associated with unresponsive staring that suggested complex partial seizures.   I discontinued Keppra and continued Lamictal monotherapy. Unfortunately, he had issues with cognition and continued seizures.   Finally I tapered Lamictal and started Depakote, and he has done extremely well on low dose Lamictal and average doses of Depakote.   He has experienced episodes of sensing the world oscillating back and forth without any other alterations in awareness of the world or his ability to function. This typically happens when he has been looking in a computer for a long time or a  movie.   These have been evaluated in detail, and I can find no evidence of seizure activity. Unfortunately, they are typically infrequent enough that we have not been able to study them with a prolonged video EEG that would be definitive. I am unaware of any seizure-like behavior of it that could cause this form of oscillopsia. I think it is more likely that this represents a migraine variant. Even with that, I have no personal experience with that  symptom as a manifestation of migraine variant.   When the patient was in [place], [place] in the summer of 2013, he had an episode of paraesthesia in his right arm that began in the forearm and spread to his fingers. He was unable to shake his arm to reestablish normal sensation. Shortly after that began, he had twitching of his thumb and index finger that lasted intermittently over 10 minutes. This did not persist, although the numbness did persist for about an hour.   The patient had been sleep deprived as I expected would occur because of meals that began at 10 p.m. and nights that often lasted until 2 or 3 in the morning. I strongly encouraged him to get enough sleep, although again I suspected that the prolonged nature of his sensory symptoms made this more likely that he had a migraine variant than a seizure.   In early September 2013 and again April 16, 2012, the patient had irregular twitching of his middle finger. He was able to capture this on video, which I reviewed. I was absolutely certain that this did not represent a focal motor seizure and conveyed that both to his mother and to [place].   He had an episode of high fever and shaking chills of unknown etiology. He recovered spontaneously.  Birth History 8 lbs. infant born at [redacted] weeks gestational age to a 21 year old primigravida  Gestation was uncomplicated  Mother received normal spontaneous vaginal delivery  Nursery Course was uncomplicated  Growth and Development was recalled as normal  Behavior History anxiety  Surgical History Past Surgical History  Procedure Laterality Date  . Wisdom tooth extraction  2011    x4   . Flexible sigmoidoscopy  12/06/2011    Procedure: FLEXIBLE SIGMOIDOSCOPY;  Surgeon: Rachael Fee, MD;  Location: [facility];  Service: Endoscopy;  Laterality: N/A;    Family History family history is not on file. Family History is negative migraines, seizures, cognitive impairment, blindness,  deafness, birth defects, chromosomal disorder, autism.  Social History History   Social History  . Marital Status: Single    Spouse Name: N/A    Number of Children: N/A  . Years of Education: N/A   Social History Main Topics  . Smoking status: Never Smoker   . Smokeless tobacco: Never Used  . Alcohol Use: Yes     Comment: Patient very rarely drinks alcohol.  . Drug Use: No  . Sexual Activity: No   Other Topics Concern  . None   Social History Narrative  . 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  Current Outpatient Prescriptions on File Prior to Visit  Medication Sig Dispense Refill  . acetaminophen (TYLENOL) 500 MG tablet Take 500 mg by mouth every 6 (six) hours as needed. Fever/pain       . DEPAKOTE ER  500 MG 24 hr tablet TAKE 1 TABLET BY MOUTH TWICE DAILY  180 tablet  5  . divalproex (DEPAKOTE SPRINKLE) 125 MG capsule Take 125 mg by mouth 2 (two) times daily.       Marland Kitchen ibuprofen (ADVIL,MOTRIN) 200 MG tablet Take 400 mg by mouth every 6 (six) hours as needed. Fever/pain       . LAMICTAL 25 MG tablet TAKE 1 TABLET BY MOUTH TWICE DAILY  186 tablet  5   No current facility-administered medications on file prior to visit.   The medication list was reviewed and reconciled. All changes or newly prescribed medications were explained.  A complete medication list was provided to the patient/caregiver.  Allergies  Allergen Reactions  . Amoxicillin Other (See Comments)    Mouth inflamed and itchy    Physical Exam BP 100/60  Pulse 84  Ht 6' 1.75" (1.873 m)  Wt 183 lb (83.008 kg)  BMI 23.66 kg/m2  General: alert, well developed, well nourished young man, in no acute distress, left-handed, brown hair, blue eyes  Head: normocephalic, no dysmorphic features   Ears, Nose and Throat: Otoscopic: tympanic membranes normal . Pharynx: oropharynx is pink without exudates or tonsillar hypertrophy.  Neck: supple, full range of motion, no cranial or cervical bruits  Respiratory: auscultation clear  Cardiovascular: no murmurs, pulses are normal  Musculoskeletal: no skeletal deformities or apparent scoliosis  Skin: no rashes or neurocutaneous lesions  Trunk: no deformities of his limbs  Neurologic Exam   Mental Status: alert; oriented to person, place, and year; knowledge is normal for age; language is normal  Cranial Nerves: visual fields are full to double simultaneous stimuli; extraocular movements are full and conjugate; pupils are round reactive to light; funduscopic examination shows sharp disc margins with normal vessels; symmetric facial strength; midline tongue and uvula; hearing is normal and symmetric  Motor: Normal strength, tone, and mass; good fine motor movements; no pronator drift.  Sensory: intact responses to touch and temperature  Coordination: good finger-to-nose, rapid repetitive alternating movements and finger apposition  Gait and Station: normal gait and station; patient is able to walk on heels, toes and tandem without difficulty; balance is adequate; Romberg exam is negative; Gower response is negative  Reflexes: symmetric and diminished bilaterally; no clonus; bilateral flexor plantar responses.  Assessment 1. Generalized nonconvulsive epilepsy, well controlled (345.00). 2. Generalized convulsive epilepsy, well controlled (345.10). 3. Subjective visual disturbance (368.10).  Discussion As mentioned above I am very pleased with Todd Young's behaviors, which I think are very healthy and have contributed to a better balance in his life.  Plan  I have no plans to change his medications at this time.  I will refill them when they run out.  I will plan to see him in six months' time, but of course will see him sooner if he has recurrent  seizures or new neurological problems.  I spent 30 minutes of face-to-face time with him, some of that was in consultation concerning his future both short-term and long-term.  Deetta Perla MD

## 2013-06-16 ENCOUNTER — Telehealth: Payer: Self-pay | Admitting: Family

## 2013-06-16 DIAGNOSIS — G40909 Epilepsy, unspecified, not intractable, without status epilepticus: Secondary | ICD-10-CM

## 2013-06-16 NOTE — Telephone Encounter (Signed)
I received a call from Dr. Kevan Ny, Veronda Prude PCP, at 6:40 PM, regarding him having abdominal and back pain with increased lipase to 287 with the possibility of pancreatitis. He is going to have abdominal CT as an outpatient for further evaluation. He is on Depakote with a dose of 625 mg twice a day as well as lamotrigine 25 mg twice a day. I recommend to add amylase as well as Depakote level to his labs and since he is symptomatic and the lipase is significantly elevated, I recommend to replace Depakote with Keppra 1 g twice a day starting from tonight. He was on Keppra before with good seizure control although it was discontinued due to temper issues.  I discussed with Dr. Kevan Ny that other options such as Topamax and zonisamide may have the same side effect of pancreatitis, in addition they have to be titrated up gradually but we are able to start Keppra with higher dose to replace Depakote. Dr. Kevan Ny is going to send a prescription for Keppra. I will call patient tomorrow to see how he is doing.

## 2013-06-16 NOTE — Telephone Encounter (Signed)
Todd Young left a message saying that he was seen by a doctor today and dx with pleurisy. He was given injections of Toradol and Depomedrol, and given Rx for Prednisone taper. He called to ask Todd Young if the medications would interfere with his seizure medications. I called Todd Young back and told him that the medications would not interact. He had no further questions.TG

## 2013-06-17 ENCOUNTER — Ambulatory Visit
Admission: RE | Admit: 2013-06-17 | Discharge: 2013-06-17 | Disposition: A | Discharge status: Home / Self Care | Payer: 59 | Source: Ambulatory Visit | Attending: Internal Medicine | Admitting: Internal Medicine

## 2013-06-17 ENCOUNTER — Other Ambulatory Visit: Payer: Self-pay | Admitting: Internal Medicine

## 2013-06-17 DIAGNOSIS — K859 Acute pancreatitis without necrosis or infection, unspecified: Secondary | ICD-10-CM

## 2013-06-17 DIAGNOSIS — R109 Unspecified abdominal pain: Secondary | ICD-10-CM

## 2013-06-17 MED ORDER — IOHEXOL 300 MG/ML  SOLN
100.0000 mL | Freq: Once | INTRAMUSCULAR | Status: AC | PRN
  Administered 2013-06-17: 100 mL via INTRAVENOUS

## 2013-06-17 NOTE — Telephone Encounter (Signed)
I received a call last night at 8 PM from mother that Keppra was eventually not controlling his seizure so she would like to start another medication. I discussed with Dr. Sharene Skeans who is his primary neurologist and decided to start him on phenytoin at this point. I called in the prescription to start him on 300 mg of phenytoin by mouth at the beginning and 100 mg 3 times a day from the next morning. I discussed the side effects of phenytoin with his father and recommend to check a blood level on Friday or Monday morning. He is going to follow with his internist PCP, Dr. Kevan Ny for further evaluation and management of pancreatitis and is in process of doing abdominal CT. Tammy please call patient this afternoon to see how he is doing and to schedule for a trough level of phenytoin Friday morning or Monday morning.

## 2013-06-17 NOTE — Telephone Encounter (Signed)
Tried calling "[name], and reached a message that said his VMB has not been set up yet, then it hung up. I called the home number and reached mom and dad. She said that Todd Young is doing fine this morning and has not had any seizures. Mom will bring pt to get trough level drawn Friday 06/19/13 at [facility] on [address]. I will fax orders when they are ready.  Pt is scheduled to see Dr. Rexene Edison in our office Monday 06/22/13.  Dr.Nab, Please enter the lab orders and let me know when they are ready to be sent. Thanks, [employer]

## 2013-06-17 NOTE — Telephone Encounter (Signed)
Phenytoin level was ordered.

## 2013-06-17 NOTE — Addendum Note (Signed)
Addended byKeturah Shavers on: 06/17/2013 07:37 PM   Modules accepted: Orders

## 2013-06-19 NOTE — Telephone Encounter (Signed)
Faxed orders

## 2013-06-19 NOTE — Telephone Encounter (Signed)
I called and talked to mother and father. Todd Young is doing better, a lipase which was increased to 350 came back to 280. He has had no seizure activity although he is slightly drowsy. He had his Dilantin level this morning although they do not need no the result. I told parents that if he develops more drowsiness, decrease the Dilantin to 100 mg twice a day. He has an appointment with Dr. Gaynell Face on Monday to decide regarding adjusting medication based on the level of medication or switching to another medication. I gave father my phone number in case of any problem during the weekend.

## 2013-06-22 ENCOUNTER — Encounter: Payer: Self-pay | Admitting: Pediatrics

## 2013-06-22 ENCOUNTER — Ambulatory Visit (INDEPENDENT_AMBULATORY_CARE_PROVIDER_SITE_OTHER): Payer: 59 | Admitting: Pediatrics

## 2013-06-22 VITALS — BP 106/70 | HR 84 | Ht 74.0 in | Wt 176.0 lb

## 2013-06-22 DIAGNOSIS — G40309 Generalized idiopathic epilepsy and epileptic syndromes, not intractable, without status epilepticus: Secondary | ICD-10-CM

## 2013-06-22 DIAGNOSIS — K859 Acute pancreatitis without necrosis or infection, unspecified: Secondary | ICD-10-CM

## 2013-06-22 DIAGNOSIS — K853 Drug induced acute pancreatitis without necrosis or infection: Secondary | ICD-10-CM

## 2013-06-22 DIAGNOSIS — T50904A Poisoning by unspecified drugs, medicaments and biological substances, undetermined, initial encounter: Secondary | ICD-10-CM

## 2013-06-22 DIAGNOSIS — H5310 Unspecified subjective visual disturbances: Secondary | ICD-10-CM

## 2013-06-22 NOTE — Patient Instructions (Signed)
Have your blood checked approximately 1 week from now.  This should be done in the late afternoon.

## 2013-06-22 NOTE — Progress Notes (Signed)
Patient: Todd Young MRN: [id number] Sex: male DOB: 05/12/1991  Provider: Deetta Perla, MD Location of Care: [facility]  Note type: Urgent return visit  History of Present Illness: Referral Source: Todd Young History from: both parents and patient Chief Complaint: Seizures/Pancreatitis   Todd Young is a 23 y.o. male who returns for ongoing evaluation and management of seizures following an episode of pancreatitis while taking Depakote.  Todd Young returns on an urgent basis.  He developed symptoms of pain that were thought to represent pleurisy, but over time were clearly abdominal and back pain.  The patient was noted to have elevated lipase, which suggested the presence of pancreatitis.  At the time, he had been taking Depakote for at least four years.  I reviewed the literature and found that 30% of patients who develop pancreatitis on Depakote had been taking the medicine for two years or longer.  I was out of the office and had a discussion with my partner, also with Todd Young, the patient's primary physician.  A decision was made to place him on phenytoin to lessen the likelihood of a generalized tonic-clonic seizure.  It is my hope that the low dose of lamotrigine will control his absence seizures.  He has a longstanding history of feeling of oscillatory movements of his eyes that have occurred without evidence of seizure activity or nystagmus.  I discussed the issues with him in detail and also with his parents and answered questions in detail.  Review of Systems: 12 system review was remarkable for aching muscles   Past Medical History  Diagnosis Date  . Seizures    Hospitalizations: no, Head Injury: no, Nervous System Infections: no, Immunizations up to date: yes Past Medical History comments: Patient was recently diagnosed with pancreatitis.  He had onset of seizures when he was 14. These were absence. Keppra worked well and brought the  episodes under control. Initially, he had problems with temper, the Keppra well controlled his seizures and it was continued until his doses were escalated to 3000 mg a day at which time Lamictal was started.   He had an EEG in October 2009 that showed bifrontal sharp waves. He an MRI scan in January 2010 that was normal without with contrast.  Patient was seen in the ER Dept. at [facility] for high fever in Nov. 2012.   He started to have generalized tonic-clonic seizures and other episodes associated with unresponsive staring that suggested complex partial seizures.   I discontinued Keppra and continued Lamictal monotherapy. Unfortunately, he had issues with cognition and continued seizures.   Finally I tapered Lamictal and started Depakote, and he has done extremely well on low dose Lamictal and average doses of Depakote.   He has experienced episodes of sensing the world oscillating back and forth without any other alterations in awareness of the world or his ability to function. This typically happens when he has been looking in a computer for a long time or a movie.   These have been evaluated in detail, and I can find no evidence of seizure activity. Unfortunately, they are typically infrequent enough that we have not been able to study them with a prolonged video EEG that would be definitive. I am unaware of any seizure-like behavior of it that could cause this form of oscillopsia. I think it is more likely that this represents a migraine variant. Even with that, I have no personal experience with that symptom as a manifestation of  migraine variant.   When the patient was in [place], [place] in the summer of 2013, he had an episode of paraesthesia in his right arm that began in the forearm and spread to his fingers. He was unable to shake his arm to reestablish normal sensation. Shortly after that began, he had twitching of his thumb and index finger that lasted intermittently over 10 minutes.  This did not persist, although the numbness did persist for about an hour.   The patient had been sleep deprived as I expected would occur because of meals that began at 10 p.m. and nights that often lasted until 2 or 3 in the morning. I strongly encouraged him to get enough sleep, although again I suspected that the prolonged nature of his sensory symptoms made this more likely that he had a migraine variant than a seizure.  In early September 2013 and again April 16, 2012, the patient had irregular twitching of his middle finger. He was able to capture this on video, which I reviewed. I was absolutely certain that this did not represent a focal motor seizure and conveyed that both to his mother and to Todd Young.  Birth History 8 lbs. infant born at [redacted] weeks gestational age to a 22 year old primigravida  Gestation was uncomplicated  Mother received normal spontaneous vaginal delivery  Nursery Course was uncomplicated  Growth and Development was recalled as normal  Behavior History anxiety  Surgical History Past Surgical History  Procedure Laterality Date  . Wisdom tooth extraction  2011    x4   . Flexible sigmoidoscopy  12/06/2011    Procedure: FLEXIBLE SIGMOIDOSCOPY;  Surgeon: Todd Banister, MD;  Location: [facility];  Service: Endoscopy;  Laterality: N/A;    Family History family history is not on file. Family History is negative migraines, seizures, cognitive impairment, blindness, deafness, birth defects, chromosomal disorder, autism.  Social History History   Social History  . Marital Status: Single    Spouse Name: N/A    Number of Children: N/A  . Years of Education: N/A   Social History Main Topics  . Smoking status: Never Smoker   . Smokeless tobacco: Never Used  . Alcohol Use: Yes     Comment: Patient very rarely drinks alcohol.  . Drug Use: No  . Sexual Activity: No   Other Topics Concern  . None   Social History Narrative  . 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  Current Outpatient Prescriptions on File Prior to Visit  Medication Sig Dispense Refill  . acetaminophen (TYLENOL) 500 MG tablet Take 500 mg by mouth every 6 (six) hours as needed. Fever/pain       . clindamycin (CLINDAGEL) 1 % gel Apply 1 application topically 2 (two) times daily.      Marland Kitchen ibuprofen (ADVIL,MOTRIN) 200 MG tablet Take 400 mg by mouth every 6 (six) hours as needed. Fever/pain       . LAMICTAL 25 MG tablet TAKE 1 TABLET BY MOUTH TWICE DAILY  186 tablet  5  . DEPAKOTE ER 500 MG 24 hr tablet TAKE 1 TABLET BY MOUTH TWICE DAILY  180 tablet  5  . divalproex (DEPAKOTE SPRINKLE) 125 MG capsule Take 125 mg by mouth 2 (two) times daily.        No current facility-administered medications on  file prior to visit.   The medication list was reviewed and reconciled. All changes or newly prescribed medications were explained.  A complete medication list was provided to the patient/caregiver.  Allergies  Allergen Reactions  . Amoxicillin Other (See Comments)    Mouth inflamed and itchy    Physical Exam BP 106/70  Pulse 84  Ht 6\' 2"  (1.88 m)  Wt 176 lb (79.833 kg)  BMI 22.59 kg/m2  General: alert, well developed, well nourished Young man, in no acute distress, left-handed, brown hair, blue eyes  Head: normocephalic, no dysmorphic features  Ears, Nose and Throat: Otoscopic: tympanic membranes normal . Pharynx: oropharynx is pink without exudates or tonsillar hypertrophy.  Neck: supple, full range of motion, no cranial or cervical bruits  Respiratory: auscultation clear  Cardiovascular: no murmurs, pulses are normal  Musculoskeletal: no skeletal deformities or apparent scoliosis  Skin: no rashes or neurocutaneous lesions  Trunk: no deformities of his limbs    Neurologic Exam  Mental Status: alert; oriented to person, place, and year; knowledge is normal for age; language is normal  Cranial Nerves: visual fields are full to double simultaneous stimuli; extraocular movements are full and conjugate; pupils are round reactive to light; funduscopic examination shows sharp disc margins with normal vessels; symmetric facial strength; midline tongue and uvula; hearing is normal and symmetric  Motor: Normal strength, tone, and mass; good fine motor movements; no pronator drift.  Sensory: intact responses to touch and temperature  Coordination: good finger-to-nose, rapid repetitive alternating movements and finger apposition  Gait and Station: normal gait and station; patient is able to walk on heels, toes and tandem without difficulty; balance is adequate; Romberg exam is negative; Gower response is negative  Reflexes: symmetric and diminished bilaterally; no clonus; bilateral flexor plantar responses.  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 3. Subjective visual disturbance 368.10. 4. Drug-induced pancreatitis 577.0.  Plan The patient is going to remain off  Depakote.  I would consider using Depakote in the future if other options fail.  There is no reason to try medicines that have previously failed.  Dilantin will hopefully take care of his generalized tonic-clonic seizures and lamotrigine his absence.  If it does not, the treatment options include sonogram, Vimpat, and Felbatol.  For now, he will take 300 mg of Dilantin.  His most recent drug level was only 5 mcg/mL.  We are going to recheck the level again tomorrow and again in a week.  If it remains below 10, I am going to increase his dose to 400 mg a day.  There is no reason he cannot take this all at once.  He has been taking it three times a day.  He will return after this semester at [employer].  I asked him to call if he has cognitive impairment on Dilantin.  I  also asked him to call if he has recurrent seizures.  I don't want to increase lamotrigine, because at higher doses, he complained that it affected his mental acuity and memory.  I spent 45 minutes of face-to-face time with the patient and his parents, more than half of it in consultation.  Jodi Geralds MD

## 2013-06-23 ENCOUNTER — Telehealth: Payer: Self-pay | Admitting: *Deleted

## 2013-06-23 NOTE — Telephone Encounter (Signed)
I spoke to the patient and his mother.  He is feeling better.  This is an unusual response to the medication although certainly not unheard of.  He had a higher dose of the medication when he was loaded with the medication on the night it was started.  I told him to continue to take 100 mg 3 times a day rather than all at once for now.  We will check a drug level and see what it is.  It may be that he can't tolerate the medication.  Laboratory study was done it [facility] yesterday.  Please see if we can get the result today.

## 2013-06-23 NOTE — Telephone Encounter (Addendum)
Todd Young the patient's mom called in and stated that they are suppose to take [name] back to school today however on yesterday before the office visit with Dr. Margie Billet took one Dilantin 100 mg and then at 11:00 pm last night he took 2 of the 100 mg Dilantin and he is feeling weak, light headed and nauseated. Mom states labs were done by Dr. Inda Merlin office on yesterday and that she has called the nurse at the office this morning to see if they can draw a stat Dilantin level on [name].  Mom sounds very anxious and is not certain that they should take him back to school because he does not look well at all to her however [name] is wanting to return to school. Mom can be reached at [phone].   Thanks,  Meredeth Ide.

## 2013-06-24 NOTE — Telephone Encounter (Signed)
Lab results obtained from Dr Inda Merlin office. Report placed in Dr Hickling's office for review. TG

## 2013-06-24 NOTE — Telephone Encounter (Signed)
Dilantin level was 7.7 mcg/mL, up from 5 mcg/mL.  Lipase has dropped to 159 units per liter.  This is still twice normal.  It is improved.

## 2013-06-25 NOTE — Telephone Encounter (Signed)
Angel, mom, lvm wanting to know if Dr.H received the Dilantin level? I called mom and informed her that he did review the results. She said that pt will continue with his care plan, and that Dr.H could call her or [name] if he wants to make any changes. Angel's number is [phone]. [name]'s number is [phone].

## 2013-06-25 NOTE — Telephone Encounter (Signed)
I called Todd Young.  He is doing well, taking Dilantin 3 capsules at nighttime and tolerating it well.  There is no reason to change current treatment.  He should get his blood level checked next week. I told him to call his mother to let her know that I had called him.  He will have the results faxed to the office for my review.  Apparently [facility] Is able to perform venipuncture.

## 2013-06-29 ENCOUNTER — Telehealth: Payer: Self-pay | Admitting: Family

## 2013-06-29 NOTE — Telephone Encounter (Addendum)
I spoke with the patient for 3 minutes.  I believe that these are migraines.  He tells me that the headache is waxing and waning and currently he does not have a headache.  I told him to keep me posted.  Depakote could have been treating these, Dilantin won't do as well.  He tells me that his never had headaches like this before.  He thinks that his father may of had some migraines.

## 2013-06-29 NOTE — Telephone Encounter (Signed)
Todd Young left a message saying that for past 2 days he has had really bad headache and he wonders if related to Dilantin. He is going in to a class and won't be out until about 1:30. I called [name] and he said that 2 days ago he awakened with pounding headache pain. He has no abdominal pain, no nausea, no visual disturbance, just holocephalic pounding pain. Advil has reduced pain some but has not relieved it. He is not sleepy, feels fine otherwise. [name] wants to talk to Dr Gaynell Face. He is having trouble reading and focusing on school work due to the pain and his mother is very worried about the headache. [name]'s number is [phone]. TG

## 2013-06-30 ENCOUNTER — Other Ambulatory Visit: Payer: Self-pay | Admitting: Neurology

## 2013-06-30 DIAGNOSIS — G40309 Generalized idiopathic epilepsy and epileptic syndromes, not intractable, without status epilepticus: Secondary | ICD-10-CM

## 2013-07-08 ENCOUNTER — Telehealth: Payer: Self-pay

## 2013-07-08 NOTE — Telephone Encounter (Signed)
Angel, mom, lvm stating that [facility]. She wanted to know if they sent [place] a copy of the results. She said that if we did not, she will have them sent over to [place]. I called mom and reached her vm. I lvm letting her know that we did not receive the results and I left our fax number.

## 2013-07-09 NOTE — Telephone Encounter (Signed)
I left a message for Mom that I have not seen lab results for [facility]. TG

## 2013-07-09 NOTE — Telephone Encounter (Signed)
Mom left a message saying that Todd Young was having back and abdominal pain again. She asked if we had received the lab results from the [facility]. Tammy, have you seen the lab results come in? Otila Kluver

## 2013-07-09 NOTE — Telephone Encounter (Signed)
Mom lvm stating that the lab results were sent to [facility] office instead of coming here. Mom said that the Dilantin level was 7.9 and Lipase 69. These labs were drawn on 06/29/13. Mo said that they are going to be drawing another Lipase level. She wants to know if we would like them to draw another Dilantin as well. Pt has been having stomach and back pain again. Please call mom at [phone].

## 2013-07-09 NOTE — Telephone Encounter (Signed)
I have not seen the labs yet. I did lvm for mom yesterday letting her know this and also gave her our fax #. She did not mention anything about pt having pain on the vm that she left yesterday.

## 2013-07-09 NOTE — Telephone Encounter (Signed)
There have been no seizures.  The patient is tolerating medicine.  I'm not certain why he's having symptoms.  It is my understanding that both Dilantin and lipase were drawn again today.  I told mother to think about liver functions as well.  At some point he needs to see a gastroenterologist.  He may need to have another CT scan of the abdomen if his lipase is increased.

## 2013-07-17 ENCOUNTER — Telehealth: Payer: Self-pay | Admitting: Family

## 2013-07-17 NOTE — Telephone Encounter (Signed)
Todd Young called asking if it is ok for him to start riding his bike again. It has been about a month since he had pancreatitis and switched meds. He hasn't had any absence seizures and has tolerated his medication without side effects. Please call after 1:10pm he will be out of class then. His number is [phone]. TG

## 2013-07-17 NOTE — Telephone Encounter (Signed)
He wants to ride his bike to and from class.  While he will be by himself, he will not be in an area by himself.  I told him that he must wear a helmet.  He also should not ride the bike if there any days were he thinks he may have had an event which has not happened yet.I told them that it was okay to ride his bike.

## 2013-07-21 ENCOUNTER — Telehealth: Payer: Self-pay | Admitting: Pediatrics

## 2013-07-21 NOTE — Telephone Encounter (Signed)
Laboratory studies from December 10, 2013 serum lipase 36 units per liter (normal) his lipid panel total cholesterol 179, triglycerides elevated at 117, HDL cholesterol 52, VLDL cholesterol 23, LDL cholesterol 103.  Triglyceride is slightly elevated but is not clinically significant.

## 2013-08-19 ENCOUNTER — Telehealth: Payer: Self-pay | Admitting: *Deleted

## 2013-08-19 NOTE — Telephone Encounter (Signed)
Glenard Haring the patient's mom has called and stated that the patient has been diagnosed with the flu and she wants to know if it's safe for him to take [facility] Flu along with his regular medications. Mom can be reached at [phone].     Thanks,   Meredeth Ide.

## 2013-08-19 NOTE — Telephone Encounter (Signed)
Noted, thanks very much!

## 2013-08-19 NOTE — Telephone Encounter (Signed)
I called and spoke with [name]. I told him that there are no known interactions with Tamiflu and his medication. TG

## 2013-09-14 ENCOUNTER — Telehealth: Payer: Self-pay | Admitting: *Deleted

## 2013-09-14 NOTE — Telephone Encounter (Signed)
[name] has called and wants to know when he will be able to drive again, he says its been three months since his medication has been changed and he has not had any any seizures and he wants to discuss with Dr. Gaynell Face when he can drive. [name] says he can be reached on his mobile after 1:30 pm today at [phone].      Thanks,  Meredeth Ide.

## 2013-09-14 NOTE — Telephone Encounter (Signed)
It again called and was unable to leave a message and he did not answer.

## 2013-09-14 NOTE — Telephone Encounter (Signed)
Unable to leave message with patient because voice mail has not been activated.

## 2013-09-15 NOTE — Telephone Encounter (Signed)
I reached Todd Young and told him that it would be okay for him to drive for 30-86 minutes around the [place], or around the [place].  I want someone driving his car between the 2 cities.

## 2013-09-21 ENCOUNTER — Telehealth: Payer: Self-pay | Admitting: Family

## 2013-09-21 NOTE — Telephone Encounter (Signed)
Mom Todd Young called about Todd Young. She said that parents saw him this weekend and he mentioned to them that he has numbness in a leg, not always same leg, at times. She said that when numbness present, goes all the way down to his toes. Todd Young thinks it is from the way he sits at times, but Mom is concerned and wonders if it is related to his medication or other problems. She is also concerned because he has lost weight. She said that he used to weigh about 190, now is 168. She said that he lost about 10 lbs with pancreatitis but that she doesn't know why he has lost weight otherwise. She has called his PCP about weight loss as well. Mom's number is [phone]. TG

## 2013-09-21 NOTE — Telephone Encounter (Signed)
I spoke with mother for 10 minutes.  The symptoms are paresthesias.  It's not clear of the distribution, except that its distal.  The patient is peak weight was 183.  He dropped to 176 when I saw him for the pancreatitis and has dropped another 8 pounds.  He is working out in feeling well.  I'm not worried about this.  He voiced his parents that he might not need the Dilantin because he did not have seizures in the transition.  Dilantin would have become therapeutic within 4 days.   He needs to continue to take Dilantin.  It's okay for him to drive short distances but not between cities on [address].  I plan to see him 6 months after I last saw him unless he has problems such as persistent symptoms of paresthesias.  When things are alternating as they are, I can't think of any organic process that causes this.

## 2013-09-22 ENCOUNTER — Telehealth: Payer: Self-pay

## 2013-09-22 NOTE — Telephone Encounter (Signed)
Todd Young, mom, lvm stating that pt's allergies are acting up. He has a Rx for Veramyst nasal spray, is this safe to use? What else can he take over the counter for allergies?   I spoke w Dr.Nab and called mom back. She said that after lvm she looked at the [facility] and it is out of date so she is going to get some saline OTC. I told her that OTC allergy meds such as Claritin or Allegra are all right to use. I also reminded her that anyone w sz do has an increased risk of having a sz when they are sick or stressed. She expressed understanding.

## 2013-10-02 ENCOUNTER — Ambulatory Visit: Payer: 59 | Admitting: Psychologist

## 2013-11-18 ENCOUNTER — Other Ambulatory Visit (INDEPENDENT_AMBULATORY_CARE_PROVIDER_SITE_OTHER): Payer: 59 | Admitting: Psychologist

## 2013-11-18 DIAGNOSIS — F810 Specific reading disorder: Secondary | ICD-10-CM

## 2013-11-23 ENCOUNTER — Other Ambulatory Visit: Payer: Self-pay

## 2013-11-23 DIAGNOSIS — G40209 Localization-related (focal) (partial) symptomatic epilepsy and epileptic syndromes with complex partial seizures, not intractable, without status epilepticus: Secondary | ICD-10-CM

## 2013-11-23 DIAGNOSIS — G40309 Generalized idiopathic epilepsy and epileptic syndromes, not intractable, without status epilepticus: Secondary | ICD-10-CM

## 2013-11-23 MED ORDER — LAMICTAL 25 MG PO TABS: ORAL_TABLET | ORAL | Status: DC

## 2013-12-09 ENCOUNTER — Other Ambulatory Visit: Payer: 59 | Admitting: Psychologist

## 2013-12-16 ENCOUNTER — Other Ambulatory Visit: Payer: 59 | Admitting: Psychologist

## 2013-12-16 DIAGNOSIS — F810 Specific reading disorder: Secondary | ICD-10-CM

## 2013-12-25 ENCOUNTER — Ambulatory Visit (INDEPENDENT_AMBULATORY_CARE_PROVIDER_SITE_OTHER): Payer: 59 | Admitting: Pediatrics

## 2013-12-25 ENCOUNTER — Encounter: Payer: Self-pay | Admitting: Pediatrics

## 2013-12-25 VITALS — BP 110/70 | HR 80 | Ht 74.0 in | Wt 176.8 lb

## 2013-12-25 DIAGNOSIS — G40209 Localization-related (focal) (partial) symptomatic epilepsy and epileptic syndromes with complex partial seizures, not intractable, without status epilepticus: Secondary | ICD-10-CM

## 2013-12-25 DIAGNOSIS — G40309 Generalized idiopathic epilepsy and epileptic syndromes, not intractable, without status epilepticus: Secondary | ICD-10-CM

## 2013-12-25 DIAGNOSIS — H5310 Unspecified subjective visual disturbances: Secondary | ICD-10-CM

## 2013-12-25 MED ORDER — LAMICTAL 25 MG PO TABS: ORAL_TABLET | ORAL | Status: DC

## 2013-12-25 MED ORDER — DILANTIN 100 MG PO CAPS: ORAL_CAPSULE | ORAL | Status: DC

## 2013-12-25 NOTE — Progress Notes (Signed)
Patient: Todd Young MRN: [id number] Sex: male DOB: 06/04/1991  Provider: Jodi Geralds, MD Location of Care: [facility] Child Neurology  Note type: Routine return visit  History of Present Illness: Referral Source: Dr. Aleda Grana History from: patient and [facility] chart Chief Complaint: Seizures  Todd Young is a 23 y.o. male who returns for evaluation and management of a well-controlled mixed seizure disorder.  Todd Young returns December 25, 2013, for the first time since June 28, 2013.  He developed pancreatitis on Depakote.  This has been a mainstay of his anti-epileptic treatment.  I made a decision to place him on phenytoin to lessen the likelihood of generalized tonic-clonic seizures, but was very concerned that we might see other seizures.  I had him remain on lamotrigine at low dose because that had been important to control absence seizures.  He continues to have episodes of feeling of the world oscillating back and forth without evidence of eye movements.  I have never been able to determine what causes this.  The episodes are sporadic and not worsening.  He had no myoclonic jerks or generalized tonic-clonic seizures and no gaps in his cognition suggesting absence.  He will enter his senior year at [employer].  He has to take two courses in order to be able to sit for the MCAT exam, which will take place in January and May 2016, which means that he will not be entering medical school in 2016.    I strongly encouraged him to pursue other things for at least one-year possibly more.  I think that it would be good for him to continue to take some distance from formal studies and work for a while.    Currently, he volunteers three days a week at the [employer] in [place], about 10 hours per week.  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  This has been very interesting to him and has reenergized his desire to become a physician.  He is  taking a single course this summer called Introduction to [place].    Physically, he has felt well.  His weight is stable.  He sleeps seven to eight hours at nighttime and feels rested.  Mentally and emotionally he seems to be the best that he has been.  He is worried that his grade point average of 3.58 is not going to be enough to get him in to medical school.  I disagree.  Review of Systems: 12 system review was unremarkable  Past Medical History  Diagnosis Date  . Seizures    Hospitalizations: No., Head Injury: No., Nervous System Infections: No., Immunizations up to date: Yes.   Past Medical History He had onset of seizures when he was 55. These were absence. [place] B[place]ze worked well and brought the episodes under control. Initially, he had problems with temper, the Keppra well controlled his seizures and it was continued until his doses were escalated to 3000 mg a day at which time Lamictal was started.   He had an EEG in October 2009 that showed bifrontal sharp waves. He an MRI scan in January 2010 that was normal without with contrast.   Patient was seen in the ER Dept. at [facility] for high fever in Nov. 2012.   He started to have generalized tonic-clonic seizures and other episodes associated with unresponsive staring that suggested complex partial seizures.   I discontinued Keppra and continued Lamictal monotherapy. Unfortunately, he had issues  with cognition and continued seizures.   Finally I tapered Lamictal and started Depakote, and he has done extremely well on low dose Lamictal and average doses of Depakote.   He has experienced episodes of sensing the world oscillating back and forth without any other alterations in awareness of the world or his ability to function. This typically happens when he has been looking in a computer for a long time or a movie.  These have been evaluated in detail, and I can find no evidence of seizure activity. Unfortunately, they are typically  infrequent enough that we have not been able to study them with a prolonged video EEG that would be definitive. I am unaware of any seizure-like behavior of it that could cause this form of oscillopsia. I think it is more likely that this represents a migraine variant. Even with that, I have no personal experience with that symptom as a manifestation of migraine variant.  When the patient was in [place], [place] in the summer of 2013, he had an episode of paraesthesia in his right arm that began in the forearm and spread to his fingers. He was unable to shake his arm to reestablish normal sensation. Shortly after that began, he had twitching of his thumb and index finger that lasted intermittently over 10 minutes. This did not persist, although the numbness did persist for about an hour.   The patient had been sleep deprived as I expected would occur because of meals that began at 10 p.m. and nights that often lasted until 2 or 3 in the morning. I strongly encouraged him to get enough sleep, although again I suspected that the prolonged nature of his sensory symptoms made this more likely that he had a migraine variant than a seizure.   In early September 2013 and again April 16, 2012, the patient had irregular twitching of his middle finger. He was able to capture this on video, which I reviewed. I was absolutely certain that this did not represent a focal motor seizure and conveyed that both to his mother and to [place].  Birth History 8 lbs. infant born at [redacted] weeks gestational age to a 22 year old primigravida  Gestation was uncomplicated  Mother received normal spontaneous vaginal delivery  Nursery Course was uncomplicated  Growth and Development was recalled as normal  Behavior History anxiety  Surgical History Past Surgical History  Procedure Laterality Date  . Wisdom tooth extraction  2011    x4   . Flexible sigmoidoscopy  12/06/2011    Procedure: FLEXIBLE SIGMOIDOSCOPY;  Surgeon: Milus Banister, MD;  Location: [facility] ENDOSCOPY;  Service: Endoscopy;  Laterality: N/A;    Family History family history is not on file. Family History is negative for migraines, seizures, intellectual disability, blindness, deafness, birth defects, chromosomal disorder, or autism.  Social History History   Social History  . Marital Status: Single    Spouse Name: N/A    Number of Children: N/A  . Years of Education: N/A   Social History Main Topics  . Smoking status: Never Smoker   . Smokeless tobacco: Never Used  . Alcohol Use: Yes     Comment: Patient very rarely drinks alcohol.  . Drug Use: No  . Sexual Activity: No   Other Topics Concern  . None   Social History Narrative  . None   Educational level university School Attending: [employer] Occupation: Student  Living with both parents  Hobbies/Interest: playing tennis and reading School comments [place] has done  well this past school year. He is a Sports coach.  Current Outpatient Prescriptions on File Prior to Visit  Medication Sig Dispense Refill  . acetaminophen (TYLENOL) 500 MG tablet Take 500 mg by mouth every 6 (six) hours as needed. Fever/pain       . DILANTIN 100 MG ER capsule Take 1 capsule 3 times per day  93 capsule  5  . ibuprofen (ADVIL,MOTRIN) 200 MG tablet Take 400 mg by mouth every 6 (six) hours as needed. Fever/pain       . LAMICTAL 25 MG tablet TAKE 1 TABLET BY MOUTH TWICE DAILY  186 tablet  0  . clindamycin (CLINDAGEL) 1 % gel Apply 1 application topically 2 (two) times daily.       No current facility-administered medications on file prior to visit.   The medication list was reviewed and reconciled. All changes or newly prescribed medications were explained.  A complete medication list was provided to the patient/caregiver.  Allergies  Allergen Reactions  . Amoxicillin Other (See Comments)    Mouth inflamed and itchy    Physical Exam BP 110/70  Pulse 80  Ht 6\' 2"  (1.88 m)  Wt 176 lb 12.8  oz (80.196 kg)  BMI 22.69 kg/m2  General: alert, well developed, well nourished young man, in no acute distress, left-handed, brown hair, blue eyes  Head: normocephalic, no dysmorphic features  Ears, Nose and Throat: Otoscopic: tympanic membranes normal . Pharynx: oropharynx is pink without exudates or tonsillar hypertrophy.  Neck: supple, full range of motion, no cranial or cervical bruits  Respiratory: auscultation clear  Cardiovascular: no murmurs, pulses are normal  Musculoskeletal: no skeletal deformities or apparent scoliosis  Skin: no rashes or neurocutaneous lesions  Trunk: no deformities of his limbs   Neurologic Exam   Mental Status: alert; oriented to person, place, and year; knowledge is normal for age; language is normal  Cranial Nerves: visual fields are full to double simultaneous stimuli; extraocular movements are full and conjugate; pupils are round reactive to light; funduscopic examination shows sharp disc margins with normal vessels; symmetric facial strength; midline tongue and uvula; hearing is normal and symmetric  Motor: Normal strength, tone, and mass; good fine motor movements; no pronator drift.  Sensory: intact responses to touch and temperature  Coordination: good finger-to-nose, rapid repetitive alternating movements and finger apposition  Gait and Station: normal gait and station; patient is able to walk on heels, toes and tandem without difficulty; balance is adequate; Romberg exam is negative; Gower response is negative  Reflexes: symmetric and diminished bilaterally; no clonus; bilateral flexor plantar responses.  Assessment 1. 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 3. Subjective visual disturbances, 368.10.    I believe that one time he had localization related seizures, but I have seen no evidence of those recently, 345.40.  Plan I refilled his Dilantin and his lamotrigine.  I encouraged him to  continue his activities as described and suggested that he might look at that as a brief part-time job when he returned to school in the fall.  I urged him to start planning some form of employment after he gets out of school that might be in the medical field, but will allow him to get some real world experience either in poverty stricken under-served communities in [place] or abroad.  The average age of medical student is rising and I think that delaying entrance into medical school produces a student who has a better vision of the world,  is more mature, and is more eager to embark on the rigorous study of medicine.  I am very optimistic that Todd Young will be successful.  I plan to see him in six months' time, sooner depending upon clinical need.  I spent 30 minutes of face-to-face time with the patient and more than half of it in consultation.  Jodi Geralds MD

## 2014-04-10 ENCOUNTER — Emergency Department (INDEPENDENT_AMBULATORY_CARE_PROVIDER_SITE_OTHER)
Admission: EM | Admit: 2014-04-10 | Discharge: 2014-04-10 | Disposition: A | Discharge status: Home / Self Care | Payer: 59 | Source: Home / Self Care | Attending: Family Medicine | Admitting: Family Medicine

## 2014-04-10 ENCOUNTER — Encounter (HOSPITAL_COMMUNITY): Payer: Self-pay | Admitting: Emergency Medicine

## 2014-04-10 ENCOUNTER — Ambulatory Visit (HOSPITAL_COMMUNITY)
Admit: 2014-04-10 | Discharge: 2014-04-10 | Disposition: A | Discharge status: Home / Self Care | Payer: 59 | Source: Ambulatory Visit | Attending: Family Medicine | Admitting: Family Medicine

## 2014-04-10 DIAGNOSIS — J188 Other pneumonia, unspecified organism: Secondary | ICD-10-CM | POA: Diagnosis not present

## 2014-04-10 DIAGNOSIS — R05 Cough: Secondary | ICD-10-CM | POA: Diagnosis present

## 2014-04-10 DIAGNOSIS — R509 Fever, unspecified: Secondary | ICD-10-CM | POA: Diagnosis present

## 2014-04-10 DIAGNOSIS — J189 Pneumonia, unspecified organism: Secondary | ICD-10-CM

## 2014-04-10 MED ORDER — MINOCYCLINE HCL 100 MG PO CAPS: 100.0000 mg | ORAL_CAPSULE | Freq: Two times a day (BID) | ORAL | Status: DC

## 2014-04-10 NOTE — ED Provider Notes (Signed)
CSN: [id number]     Arrival date & time 04/10/14  1841 History   First MD Initiated Contact with Patient 04/10/14 1901     Chief Complaint  Patient presents with  . Fever   (Consider location/radiation/quality/duration/timing/severity/associated sxs/prior Treatment) HPI      23 year old male presents complaining of cough, fever, body aches. This started approximately 3 days ago. He had acute onset of 102F fever and body aches, and a productive cough. His symptoms have persisted for 3 days. He has been alternating ibuprofen and Tylenol for the fever and body aches. He has very mild shortness of breath as well. No chest pain, wheezing, chest tightness. he has a history of epilepsy, no history respiratory disorders.  Past Medical History  Diagnosis Date  . Seizures    Past Surgical History  Procedure Laterality Date  . Wisdom tooth extraction  2011    x4   . Flexible sigmoidoscopy  12/06/2011    Procedure: FLEXIBLE SIGMOIDOSCOPY;  Surgeon: Milus Banister, MD;  Location: [facility];  Service: Endoscopy;  Laterality: N/A;   History reviewed. No pertinent family history. History  Substance Use Topics  . Smoking status: Never Smoker   . Smokeless tobacco: Never Used  . Alcohol Use: Yes     Comment: Patient very rarely drinks alcohol.    Review of Systems  Constitutional: Positive for fever and chills.  HENT: Negative for congestion, ear pain, sinus pressure and sore throat.   Respiratory: Positive for cough and shortness of breath. Negative for chest tightness and wheezing.   Cardiovascular: Negative for chest pain.  Gastrointestinal: Negative for vomiting and diarrhea.  Musculoskeletal: Positive for myalgias.  All other systems reviewed and are negative.   Allergies  Amoxicillin  Home Medications   Prior to Admission medications   Medication Sig Start Date End Date Taking? Authorizing Provider  acetaminophen (TYLENOL) 500 MG tablet Take 500 mg by mouth every 6 (six) hours  as needed. Fever/pain     Historical Provider, MD  clindamycin (CLINDAGEL) 1 % gel Apply 1 application topically 2 (two) times daily.    Historical Provider, MD  DILANTIN 100 MG ER capsule Take 3 capsules at nighttime 12/25/13   Jodi Geralds, MD  ibuprofen (ADVIL,MOTRIN) 200 MG tablet Take 400 mg by mouth every 6 (six) hours as needed. Fever/pain     Historical Provider, MD  LAMICTAL 25 MG tablet TAKE 1 TABLET BY MOUTH TWICE DAILY 12/25/13   Jodi Geralds, MD  minocycline (MINOCIN) 100 MG capsule Take 1 capsule (100 mg total) by mouth 2 (two) times daily. 04/10/14   Todd Caldron Allecia Bells, PA-C   BP 140/79  Pulse 110  Temp(Src) 99.8 F (37.7 C) (Oral)  Resp 16  SpO2 98% Physical Exam  Nursing note and vitals reviewed. Constitutional: He is oriented to person, place, and time. He appears well-developed and well-nourished. No distress.  HENT:  Head: Normocephalic and atraumatic.  Right Ear: External ear normal.  Left Ear: External ear normal.  Nose: Nose normal. Right sinus exhibits no maxillary sinus tenderness and no frontal sinus tenderness. Left sinus exhibits no maxillary sinus tenderness and no frontal sinus tenderness.  Mouth/Throat: Oropharynx is clear and moist. No oropharyngeal exudate.  Eyes: Conjunctivae are normal. Right eye exhibits no discharge. Left eye exhibits no discharge.  Neck: Normal range of motion. Neck supple.  Cardiovascular: Regular rhythm and normal heart sounds.  Tachycardia present.   Pulmonary/Chest: Effort normal and breath sounds normal. No respiratory distress. He has no  wheezes. He has no rales.  Lymphadenopathy:    He has no cervical adenopathy.  Neurological: He is alert and oriented to person, place, and time. Coordination normal.  Skin: Skin is warm and dry. No rash noted. He is not diaphoretic.  Psychiatric: He has a normal mood and affect. Judgment normal.    ED Course  Procedures (including critical care time) Labs Review Labs Reviewed -  No data to display  Imaging Review Dg Chest 2 View  04/10/2014   CLINICAL DATA:  Fever over 100 with cough and shortness of breath.  EXAM: CHEST  2 VIEW  COMPARISON:  05/09/2011  FINDINGS: Normal heart, hilar, and mediastinal contours. The trachea is midline. There is patchy airspace disease in the right lower lobe, consistent with pneumonia. The left lung is well expanded and clear. Negative for pleural effusion or pneumothorax. Visualized bones and upper abdomen are unremarkable.  IMPRESSION: Right lower lobe pneumonia. Suggest radiographic followup to clearing.   Electronically Signed   By: Curlene Dolphin M.D.   On: 04/10/2014 19:32     MDM   1. CAP (community acquired pneumonia)    Symptoms are most consistent with influenza. Will order outpatient chest x-ray, rule out pneumonia. He denies any treatment for his symptoms. He is outside of the time window to benefit from Tamiflu. Followup if worsening    His x-ray revealed right lower lobe infiltrate consistent with pneumonia. Treated with minocycline twice a day for 7 days. He'll go to the emergency department if worsening. Radiologist Suggested followup x-ray in 6 weeks, this was conveyed to the patient as well.    Meds ordered this encounter  Medications  . minocycline (MINOCIN) 100 MG capsule    Sig: Take 1 capsule (100 mg total) by mouth 2 (two) times daily.    Dispense:  14 capsule    Refill:  0    Order Specific Question:  Supervising Provider    Answer:  Lynne Leader, [place]      Liam Graham, PA-C 04/10/14 1947

## 2014-04-10 NOTE — Discharge Instructions (Signed)

## 2014-04-10 NOTE — ED Notes (Signed)
Fever   Body  Aches  / chills     X 3  Days       Symptoms      releived  By         Tylenol / advil            Pt  Has  A  History  Of  Seizures  But  None  Recently

## 2014-04-11 NOTE — ED Provider Notes (Signed)
Medical screening examination/treatment/procedure(s) were performed by a resident physician or non-physician practitioner and as the supervising physician I was immediately available for consultation/collaboration.  Lynne Leader, MD   Gregor Hams, MD 04/11/14 [id number]

## 2014-04-12 ENCOUNTER — Telehealth: Payer: Self-pay | Admitting: Family

## 2014-04-12 NOTE — Telephone Encounter (Signed)
Todd Young left message saying that he was dx with pneumonia on Sat - he was given Rx for Levaquin. Todd Young wants to talk to Dr Gaynell Face - he is concerned about possible interaction between Levaquin and his seizure medications Dilantin and Levaquin and wants to verify that he can take it with Dilantin and Lamictal. Please call Todd Young at [phone]. TG

## 2014-04-12 NOTE — Telephone Encounter (Signed)
I checked with the [facility] pharmacy and they agree that Levaquin is not going to interact with Dilantin and lamotrigine.  I called the patient.  I also told him that he could check with his own pharmacy dispensing the drug to get this information and that I wanted to make certain that he took the medicine to get rid of his pneumonia.

## 2014-04-13 ENCOUNTER — Other Ambulatory Visit: Payer: Self-pay

## 2014-04-13 DIAGNOSIS — G40309 Generalized idiopathic epilepsy and epileptic syndromes, not intractable, without status epilepticus: Secondary | ICD-10-CM

## 2014-04-13 DIAGNOSIS — G40209 Localization-related (focal) (partial) symptomatic epilepsy and epileptic syndromes with complex partial seizures, not intractable, without status epilepticus: Secondary | ICD-10-CM

## 2014-04-13 MED ORDER — LAMICTAL 25 MG PO TABS: ORAL_TABLET | ORAL | Status: DC

## 2014-05-04 ENCOUNTER — Ambulatory Visit
Admission: RE | Admit: 2014-05-04 | Discharge: 2014-05-04 | Disposition: A | Discharge status: Home / Self Care | Payer: 59 | Source: Ambulatory Visit | Attending: Internal Medicine | Admitting: Internal Medicine

## 2014-05-04 ENCOUNTER — Other Ambulatory Visit: Payer: Self-pay | Admitting: Internal Medicine

## 2014-05-04 DIAGNOSIS — J189 Pneumonia, unspecified organism: Secondary | ICD-10-CM

## 2014-06-07 ENCOUNTER — Other Ambulatory Visit: Payer: Self-pay | Admitting: Pediatrics

## 2014-06-28 ENCOUNTER — Ambulatory Visit: Payer: 59 | Admitting: Pediatrics

## 2014-07-06 ENCOUNTER — Other Ambulatory Visit: Payer: Self-pay | Admitting: Family

## 2014-08-09 ENCOUNTER — Encounter: Payer: Self-pay | Admitting: Pediatrics

## 2014-08-09 ENCOUNTER — Ambulatory Visit (INDEPENDENT_AMBULATORY_CARE_PROVIDER_SITE_OTHER): Payer: 59 | Admitting: Pediatrics

## 2014-08-09 VITALS — BP 100/70 | HR 72 | Ht 74.0 in | Wt 178.0 lb

## 2014-08-09 DIAGNOSIS — G40209 Localization-related (focal) (partial) symptomatic epilepsy and epileptic syndromes with complex partial seizures, not intractable, without status epilepticus: Secondary | ICD-10-CM

## 2014-08-09 DIAGNOSIS — G40309 Generalized idiopathic epilepsy and epileptic syndromes, not intractable, without status epilepticus: Secondary | ICD-10-CM

## 2014-08-09 DIAGNOSIS — H5310 Unspecified subjective visual disturbances: Secondary | ICD-10-CM

## 2014-08-09 DIAGNOSIS — R259 Unspecified abnormal involuntary movements: Secondary | ICD-10-CM

## 2014-08-09 NOTE — Progress Notes (Signed)
Patient: Todd Young MRN: [id number] Sex: male DOB: Jul 04, 1990  Provider: Jodi Geralds, MD Location of Care: [facility]  Note type: Routine return visit  History of Present Illness: Referral Source: Dr. Kelton Pillar History from: patient and [facility] chart Chief Complaint: Epilepsy  Todd Young is a 24 y.o. male who was evaluated on August 09, 2014 for the first time since December 25, 2013.  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  He has been on a variety of medications, which are detailed in the past history.  He continues to have infrequent episodes of the world oscillating back and forth.   These do not occur with altered mental status.  He had a couple of episodes where he has some twitching of his left thumb, which is his dominant hand.  Again this does not appear to be related to a seizure.  Videos have been made in the behavior.  He does not twitch with the frequency and the appearance of a focal seizure.  He has had no other significant medical problems.  In general, he seems more at ease and less anxious than I have seen him.  He will finish his senior year in the spring and is contemplating a gap year possibly at [employer] gaining a Masters in Tax adviser work.  He is working as a Education administrator  at [employer].  I strongly encouraged him to continue activities outside the academic realm.  I think that he will be much better off if he enters medical school having taken some time away from structured learning.  Review of Systems: 12 system review was unremarkable  Past Medical History Diagnosis Date  . Seizures    Hospitalizations: No., Head Injury: No., Nervous System Infections: No., Immunizations up to date: Yes.    Onset of seizures when he was 14. These were absence. Todd Young worked well and brought the episodes under control. Initially, he had problems with  temper, Keppra controlled his seizures and it was continued until his doses were escalated to 3000 mg a day at which time Lamictal was started. I discontinued Keppra and continued Lamictal monotherapy. Unfortunately, he had issues with cognition and continued seizures.  Lamictal was tapered and Depakote started.  He did extremely well on low dose Lamictal and average doses of Depakote.  In late December, 2014 he developed pancreatitis on Depakote. I made a decision to place him on phenytoin to lessen the likelihood of generalized tonic-clonic seizures  He had an EEG in October 2009 that showed bifrontal sharp waves. He an MRI scan in January 2010 that was normal without with contrast.   Patient was seen in the ER Dept. at [facility] for high fever in Nov. 2012.  He started to have generalized tonic-clonic seizures and other episodes associated with unresponsive staring that suggested complex partial seizures.  He has experienced episodes of sensing the world oscillating back and forth without any other alterations in awareness of the world or his ability to function. This typically happens when he has been looking in a computer for a long time or a movie.  These have been evaluated in detail, and I can find no evidence of seizure activity. Unfortunately, they are typically infrequent enough that we have not been able to study them with a prolonged video EEG that would be definitive. I am unaware of any seizure-like behavior of it that could cause this form of oscillopsia.  I think it is more likely that this represents a migraine variant. Even with that, I have no personal experience with that symptom as a manifestation of migraine variant.  When the patient was in [place], [place] in the summer of 2013, he had an episode of paraesthesia in his right arm that began in the forearm and spread to his fingers. He was unable to shake his arm to reestablish normal sensation. Shortly after that began,  he had twitching of his thumb and index finger that lasted intermittently over 10 minutes. This did not persist, although the numbness did persist for about an hour.   The patient had been sleep deprived as I expected would occur because of meals that began at 10 p.m. and nights that often lasted until 2 or 3 in the morning. I strongly encouraged him to get enough sleep, although again I suspected that the prolonged nature of his sensory symptoms made this more likely that he had a migraine variant than a seizure.  In early September 2013 and again April 16, 2012, the patient had irregular twitching of his middle finger. He was able to capture this on video, which I reviewed. I was absolutely certain that this did not represent a focal motor seizure and conveyed that both to his mother and to Todd Young.  Birth History 8 lbs. infant born at [redacted] weeks gestational age to a 23 year old primigravida  Gestation was uncomplicated  Mother received normal spontaneous vaginal delivery  Nursery Course was uncomplicated  Growth and Development was recalled as normal  Behavior History none  Surgical History Procedure Laterality Date  . Wisdom tooth extraction  2011    x4   . Flexible sigmoidoscopy  12/06/2011    Procedure: FLEXIBLE SIGMOIDOSCOPY;  Surgeon: Milus Banister, MD;  Location: [facility];  Service: Endoscopy;  Laterality: N/A;   Family History family history is not on file. Family history is negative for migraines, seizures, intellectual disabilities, blindness, deafness, birth defects, chromosomal disorder, or autism.  Social History . Marital Status: Single    Spouse Name: N/A  . Number of Children: N/A  . Years of Education: N/A   Social History Main Topics  . Smoking status: Never Smoker   . Smokeless tobacco: Never Used  . Alcohol Use: Yes     Comment: Patient very rarely drinks alcohol.  . Drug Use: No  . Sexual Activity: No   Social History Tourist information centre manager School Attending: [employer]  Occupation: Ship broker, Retail buyer at [employer]; Living with roomates  Hobbies/Interest: none School comments [name] is doing well this semester. He is finishing up his last year of college at [employer], [place] in Education officer, museum.  His ultimate goal is medical school.  Allergies Allergen Reactions  . Amoxicillin Other (See Comments)    Mouth inflamed and itchy  . Divalproex Sodium Other (See Comments)    Pancreatitis   Physical Exam BP 100/70 mmHg  Pulse 72  Ht 6\' 2"  (1.88 m)  Wt 178 lb (80.74 kg)  BMI 22.84 kg/m2  General: alert, well developed, well nourished, in no acute distress, brown hair, blue eyes, left handed Head: normocephalic, no dysmorphic features Ears, Nose and Throat: Otoscopic: tympanic membranes normal; pharynx: oropharynx is pink without exudates or tonsillar hypertrophy Neck: supple, full range of motion, no cranial or cervical bruits Respiratory: auscultation clear Cardiovascular: no murmurs, pulses are normal Musculoskeletal: no skeletal deformities or apparent scoliosis Skin: no rashes or neurocutaneous lesions  Neurologic Exam  Mental Status:  alert; oriented to person, place and year; knowledge is normal for age; language is normal Cranial Nerves: visual fields are full to double simultaneous stimuli; extraocular movements are full and conjugate; pupils are round reactive to light; funduscopic examination shows sharp disc margins with normal vessels; symmetric facial strength; midline tongue and uvula; air conduction is greater than bone conduction bilaterally Motor: Normal strength, tone and mass; good fine motor movements; no pronator drift Sensory: intact responses to cold, vibration, proprioception and stereognosis Coordination: good finger-to-nose, rapid repetitive alternating movements and finger apposition Gait and Station: normal gait and station: patient is able to walk on heels, toes and tandem without  difficulty; balance is adequate; Romberg exam is negative; Gower response is negative Reflexes: symmetric and diminished bilaterally; no clonus; bilateral flexor plantar responses  Assessment 1. Generalized convulsive epilepsy, G40.309 2. Generalized non-convulsive epilepsy, G40.309. 3. Partial epilepsy with impairment of consciousness, G40.209. 4. Subjective visual disturbances, H53.10. 5. Abnormal involuntary movement, R25.9.   Discussion [name] is medically stable.  There is no reason make changes in his treatment.  Plan No change will be made in Dilantin or lamotrigine.  He has a one year prescriptions for those medicines and so they were not refilled today.  I plan to see him in six months, sooner depending upon clinical need.   Medication List   This list is accurate as of: 08/09/14 11:32 AM.       acetaminophen 500 MG tablet  Commonly known as:  TYLENOL  Take 500 mg by mouth every 6 (six) hours as needed. Fever/pain     DILANTIN 100 MG ER capsule  Generic drug:  phenytoin  TAKE 3 CAPSULES AT NIGHTTIME     ibuprofen 200 MG tablet  Commonly known as:  ADVIL,MOTRIN  Take 400 mg by mouth every 6 (six) hours as needed. Fever/pain     LAMICTAL 25 MG tablet  Generic drug:  lamoTRIgine  TAKE 1 TABLET BY MOUTH TWICE DAILY      The medication list was reviewed and reconciled. All changes or newly prescribed medications were explained.  A complete medication list was provided to the patient/caregiver.  Jodi Geralds MD

## 2014-08-20 ENCOUNTER — Telehealth: Payer: Self-pay | Admitting: *Deleted

## 2014-08-20 NOTE — Telephone Encounter (Signed)
Angel the patient's mom has called asking that Dr. Gaynell Face call [name], he will be leaving for [place] next week for spring break and he wants to talk to Dr. Gaynell Face before leaving. His mother wanted Dr. Gaynell Face to see the patient today or Mon. Tues or Wed. Of next week and I informed her that Dr. Melanee Left schedule is completley full. [name] would like to speak with Dr. Gaynell Face in regards to the recent seizure activity, he can be reached at [phone] or he can call his mother if unable to reach him at [phone]. MB

## 2014-08-20 NOTE — Telephone Encounter (Signed)
I spoke with [name] this morning and we'll see him in 8:15 on Monday.

## 2014-08-23 ENCOUNTER — Ambulatory Visit (INDEPENDENT_AMBULATORY_CARE_PROVIDER_SITE_OTHER): Payer: 59 | Admitting: Pediatrics

## 2014-08-23 ENCOUNTER — Encounter: Payer: Self-pay | Admitting: Pediatrics

## 2014-08-23 VITALS — BP 105/70 | HR 84 | Ht 74.0 in | Wt 178.6 lb

## 2014-08-23 DIAGNOSIS — R209 Unspecified disturbances of skin sensation: Secondary | ICD-10-CM

## 2014-08-23 DIAGNOSIS — H5310 Unspecified subjective visual disturbances: Secondary | ICD-10-CM | POA: Diagnosis not present

## 2014-08-23 DIAGNOSIS — G40309 Generalized idiopathic epilepsy and epileptic syndromes, not intractable, without status epilepticus: Secondary | ICD-10-CM | POA: Diagnosis not present

## 2014-08-23 MED ORDER — DILANTIN 100 MG PO CAPS: ORAL_CAPSULE | ORAL | Status: DC

## 2014-08-23 NOTE — Patient Instructions (Signed)
It is okay to make your trip to [place] but please wise in terms of trying to keep a regular day with that time no later than midnight and 8 or 9 hours of sleep.

## 2014-08-23 NOTE — Progress Notes (Signed)
Patient: Todd Young MRN: [id number] Sex: male DOB: Feb 03, 1991  Provider: Jodi Geralds, MD Location of Care: [facility]  Note type: Urgent return visit  History of Present Illness: Referral Source: Dr. Kelton Pillar  History from: patient and [facility] chart Chief Complaint: Seizures   Todd Young is a 24 y.o. male who returns August 23, 2014, for the first time since August 09, 2013.  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  He had absence seizures when he was 14, which was low and relatively late onset.  He had generalized tonic-clonic seizures the last of which was about five years ago.  He had been well controlled on the combination of Lamictal and Depakote until he develops pancreatitis necessitating discontinuation of Depakote in its place Dilantin was started.  He experienced a generalized tonic-clonic seizure on August 18, 2014.  He had gone to the gym to weight lift and was by himself, although there were other students in the gym.  Somewhere around 6 to 6:10 p.m. after he finished repetitions on a rowing machine, he thinks that he got up to get a drink of water.  He has no actual recollection for the events thereafter.  He awakened on the floor and there were two paramedics already there.  He is aware that somebody caught him, so he did not fall and injure himself.  He had a bruise on his right arm, he did not bite his tongue, have incontinence nor he had headache or generalized muscle aches.  He was transferred to [facility] arriving around 6:20.  His father contacted me and I agreed to discuss his case with the staff at [facility] emergency department.  He had no further seizures and was able to be discharged from the hospital that night.  I asked him to return today so that we could discuss further treatment.  That night, I recommended increasing Dilantin from  300 mg a day to 400 mg.  His Dilantin level was 8 mcg/mL, which was a relative trough level.  He was certainly at the end of dose for Dilantin which he takes at night when he had a seizure.  Whether or not that had anything to do with it is unclear.  Todd Young's calcium was 7.8.  He did not have an albumin level checked.  He was treated with calcium gluconate, although I do not think that his calcium level played a role in his seizures.  He remembers getting a good night sleep the night before.  He has been compliant with his medication.  There was no clear reason for his recurrent seizures.  He has two other episodes that are of concern.  He has had both numbness and twitching in the left thumb, which is his dominant hand.  He also has experienced oscillatory movements and world without any obvious nystagmoid eye movements.  These have been evaluated and do not appear to be related to seizures.  His last EEG was in October 2009, and showed bifrontal sharp waves.  MRI scan in January 2010, was normal without contrast.  Medications that he has taken prior to this time include Keppra, Lamictal, Depakote, and Dilantin.  He has been able tolerate low-dose Lamictal, which has controlled absence seizures and Dilantin, which until now have controlled his generalized tonic-clonic seizures.  Review of Systems: 12 system review was remarkable for seizures   Past Medical History Diagnosis Date  .  Seizures    Hospitalizations: No., Head Injury: No., Nervous System Infections: No., Immunizations up to date: Yes.     Onset of seizures when he was 14. These were absence. Eugene worked well and brought the episodes under control. Initially, he had problems with temper, Keppra controlled his seizures and it was continued until his doses were escalated to 3000 mg a day at which time Lamictal was started. I discontinued Keppra and continued Lamictal monotherapy. Unfortunately, he had issues with cognition and continued  seizures.  Lamictal was tapered and Depakote started. He did extremely well on low dose Lamictal and average doses of Depakote.  In late December, 2014 he developed pancreatitis on Depakote. I made a decision to place him on phenytoin to lessen the likelihood of generalized tonic-clonic seizures  He had an EEG in October 2009 that showed bifrontal sharp waves. He an MRI scan in January 2010 that was normal without with contrast.   Patient was seen in the ER Dept. at [facility] for high fever in Nov. 2012.  He started to have generalized tonic-clonic seizures and other episodes associated with unresponsive staring that suggested complex partial seizures.  He has experienced episodes of sensing the world oscillating back and forth without any other alterations in awareness of the world or his ability to function. This typically happens when he has been looking in a computer for a long time or a movie.  These have been evaluated in detail, and I can find no evidence of seizure activity. Unfortunately, they are typically infrequent enough that we have not been able to study them with a prolonged video EEG that would be definitive. I am unaware of any seizure-like behavior of it that could cause this form of oscillopsia. I think it is more likely that this represents a migraine variant. Even with that, I have no personal experience with that symptom as a manifestation of migraine variant.  When the patient was in [place], [place] in the summer of 2013, he had an episode of paraesthesia in his right arm that began in the forearm and spread to his fingers. He was unable to shake his arm to reestablish normal sensation. Shortly after that began, he had twitching of his thumb and index finger that lasted intermittently over 10 minutes. This did not persist, although the numbness did persist for about an hour.   The patient had been sleep deprived as I expected would occur because of meals that began  at 10 p.m. and nights that often lasted until 2 or 3 in the morning. I strongly encouraged him to get enough sleep, although again I suspected that the prolonged nature of his sensory symptoms made this more likely that he had a migraine variant than a seizure.  In early September 2013 and again April 16, 2012, the patient had irregular twitching of his middle finger. He was able to capture this on video, which I reviewed. I was absolutely certain that this did not represent a focal motor seizure and conveyed that both to his mother and to [facility].  ER visit on 08/18/14 due to seizure activity.  Birth History 8 lbs. infant born at [redacted] weeks gestational age to a 23 year old primigravida  Gestation was uncomplicated  Mother received normal spontaneous vaginal delivery  Nursery Course was uncomplicated  Growth and Development was recalled as normal  Behavior History none  Surgical History Procedure Laterality Date  . Wisdom tooth extraction  2011    x4   . Flexible sigmoidoscopy  12/06/2011    Procedure: FLEXIBLE SIGMOIDOSCOPY;  Surgeon: Milus Banister, MD;  Location: [facility];  Service: Endoscopy;  Laterality: N/A;   Family History family history is not on file. Family history is negative for migraines, seizures, intellectual disabilities, blindness, deafness, birth defects, chromosomal disorder, or autism.  Social History . Marital Status: Single    Spouse Name: N/A  . Number of Children: N/A  . Years of Education: N/A   Social History Main Topics  . Smoking status: Never Smoker   . Smokeless tobacco: Never Used  . Alcohol Use: Yes     Comment: Patient very rarely drinks alcohol.  . Drug Use: No  . Sexual Activity: No   Social History Narrative  Educational level university School Attending: [employer] Occupation: Student/Works for [employer] with housemates   Hobbies/Interest: Enjoys spending time with family and friends.  School comments  [place] is doing well in school he's a senior attending [employer] pursuing a degree in Biology.   Allergies Allergen Reactions  . Amoxicillin Other (See Comments)    Mouth inflamed and itchy  . Divalproex Sodium Other (See Comments)    Pancreatitis   Physical Exam BP 105/70 mmHg  Pulse 84  Ht 6\' 2"  (1.88 m)  Wt 178 lb 9.6 oz (81.012 kg)  BMI 22.92 kg/m2  General: alert, well developed, well nourished, in no acute distress, brown hair, blue eyes, left handed Head: normocephalic, no dysmorphic features Ears, Nose and Throat: Otoscopic: tympanic membranes normal; pharynx: oropharynx is pink without exudates or tonsillar hypertrophy Neck: supple, full range of motion, no cranial or cervical bruits Respiratory: auscultation clear Cardiovascular: no murmurs, pulses are normal Musculoskeletal: no skeletal deformities or apparent scoliosis Skin: no rashes or neurocutaneous lesions  Neurologic Exam  Mental Status: alert; oriented to person, place and year; knowledge is normal for age; language is normal Cranial Nerves: visual fields are full to double simultaneous stimuli; extraocular movements are full and conjugate; pupils are round reactive to light; funduscopic examination shows sharp disc margins with normal vessels; symmetric facial strength; midline tongue and uvula; air conduction is greater than bone conduction bilaterally Motor: Normal strength, tone and mass; good fine motor movements; no pronator drift Sensory: intact responses to cold, vibration, proprioception and stereognosis Coordination: good finger-to-nose, rapid repetitive alternating movements and finger apposition Gait and Station: normal gait and station: patient is able to walk on heels, toes and tandem without difficulty; balance is adequate; Romberg exam is negative; Gower response is negative Reflexes: symmetric and diminished bilaterally; no clonus; bilateral flexor plantar responses  Assessment 1. Generalized  convulsive epilepsy, G40.309. 2. Generalized nonconvulsive epilepsy, G40.309. 3. Subjective visual disturbance, H53.10. 4. Disturbance of skin sensation, R20.9. 5. Hypocalcemia, E83.51.  Discussion It is not clear why he had a breakthrough seizure.  The Dilantin level of 8, which is to be expected for the relatively low dose of Dilantin given had controlled his seizures for over 14 months.  Plan Dilantin was increased to 400 mg at nighttime.  I see no reason to split the dose because it will make it harder for him to remember.   He is taking 25 mg of lamotrigine twice daily, so he already has the need for twice a day dosing.  I do not think he needs an EEG at this time, although if he has any further seizures, I would not hesitate to repeat one.   I told his mother that it is okay for him to go  on a spring break to [place], but I strongly urged him to try to keep a regular lifestyle in terms of not going to bed too late or sleeping too late so that he gets out of rhythm with taking his medication.   I told him that he cannot drive by himself for at least six months longer if he have more seizures.  He will return to see me in three months.  I spent 45-minutes of face-to-face time with [name] and his mother, more than half of it in consultation.   Medication List   This list is accurate as of: 08/23/14 11:59 PM.       acetaminophen 500 MG tablet  Commonly known as:  TYLENOL  Take 500 mg by mouth every 6 (six) hours as needed. Fever/pain     DILANTIN 100 MG ER capsule  Generic drug:  phenytoin  Take 4 capsules by mouth at nighttime     ibuprofen 200 MG tablet  Commonly known as:  ADVIL,MOTRIN  Take 400 mg by mouth every 6 (six) hours as needed. Fever/pain     LAMICTAL 25 MG tablet  Generic drug:  lamoTRIgine  TAKE 1 TABLET BY MOUTH TWICE DAILY      The medication list was reviewed and reconciled. All changes or newly prescribed medications were explained.  A complete medication list was  provided to the patient/caregiver.  Jodi Geralds MD

## 2014-09-20 ENCOUNTER — Telehealth: Payer: Self-pay | Admitting: Pediatrics

## 2014-09-20 NOTE — Telephone Encounter (Signed)
Laboratory studies are normal.  Calcium 9.7, phosphorus 5.4, ionized calcium 5.1, phenytoin 16.2 mcg/mL.  I called Todd Young and told him that things were normal.  He had no further questions.  He has not experienced any further seizures and is tolerating his medicine well.

## 2014-09-28 ENCOUNTER — Ambulatory Visit: Admit: 2014-09-28 | Disposition: A | Discharge status: Home / Self Care | Payer: Self-pay | Admitting: Family Medicine

## 2014-09-28 LAB — RAPID INFLUENZA A&B ANTIGENS (ARMC ONLY)

## 2014-09-28 LAB — RAPID STREP-A WITH REFLX: Micro Text Report: NEGATIVE

## 2014-10-01 LAB — BETA STREP CULTURE(ARMC)

## 2014-11-22 ENCOUNTER — Telehealth: Payer: Self-pay

## 2014-11-22 DIAGNOSIS — G40309 Generalized idiopathic epilepsy and epileptic syndromes, not intractable, without status epilepticus: Secondary | ICD-10-CM

## 2014-11-22 NOTE — Telephone Encounter (Signed)
"  Todd Young" lvm stating that he has had difficulty with memory and recalling information x 2-3 weeks. He stated that he has trouble recalling familiar people's names. He is wondering if this could be a side effect to medication. Please call "[name]" at: [phone].

## 2014-11-22 NOTE — Telephone Encounter (Signed)
The patient is having problems remembering names of people he knows, sports trivia, the voices associated with songs that he knows.  He did very well in his finals.  I only this has anything to do with Dilantin.  He is not having blurred vision or unsteadiness or diplopia.  We will check a evening trough Dilantin level.  This will be done to the [employer] on [address] in the [place] [facility] building.

## 2014-11-24 LAB — PHENYTOIN LEVEL, TOTAL: PHENYTOIN LVL: 21.9 ug/mL — AB (ref 10.0–20.0)

## 2014-11-24 NOTE — Telephone Encounter (Signed)
Dilantin 21.9 mcg/mL  Level is in the low toxic range.  I can't be certain that this is not affecting his cognition.

## 2014-12-02 NOTE — Telephone Encounter (Signed)
I spoke with mother for 6-1/2 minutes.  This was not a trough level however there is no reason To repeat the study or make changes, because is a fairly minor issue and even if we obtained a trough level, we still have the problem.  We will discuss possible treatments when I see him June 30.  I have no plans to make changes now.

## 2014-12-02 NOTE — Telephone Encounter (Signed)
Glenard Haring, Jazmin's mom called and left a voicemail requesting a call back regarding results from last labs which were drawn last week. She states she hasn't heard back from anyone, after calling the office twice, regarding these results and would like to know if a trough level is needed. She states that last weeks levels were not trough and they were leaving to go out of town but if more labs needed they were back and town and could make this happen. She states they have an appointment June 30th. She also stated she is aware that it may be difficult to get in touch with Lynda and that we may contact her if need be with any information, questions and/or lab results at [phone].

## 2014-12-16 ENCOUNTER — Encounter: Payer: Self-pay | Admitting: Pediatrics

## 2014-12-16 ENCOUNTER — Ambulatory Visit (INDEPENDENT_AMBULATORY_CARE_PROVIDER_SITE_OTHER): Payer: 59 | Admitting: Pediatrics

## 2014-12-16 VITALS — BP 112/74 | HR 80 | Ht 74.0 in | Wt 181.4 lb

## 2014-12-16 DIAGNOSIS — G40309 Generalized idiopathic epilepsy and epileptic syndromes, not intractable, without status epilepticus: Secondary | ICD-10-CM | POA: Diagnosis not present

## 2014-12-16 DIAGNOSIS — G40209 Localization-related (focal) (partial) symptomatic epilepsy and epileptic syndromes with complex partial seizures, not intractable, without status epilepticus: Secondary | ICD-10-CM | POA: Diagnosis not present

## 2014-12-16 NOTE — Patient Instructions (Addendum)
I don't think that the symptoms that you raised today are related to Dilantin.  I'll be happy to see you this fall if you need my help.  I will be happy to fill out the form for [facility] in September when they requested a reevaluation.  Good luck in your studies.

## 2014-12-16 NOTE — Progress Notes (Signed)
Patient: Todd Young MRN: [id number] Sex: male DOB: Jan 27, 1991  Provider: Jodi Geralds, MD Location of Care: [facility]  Note type: Routine return visit  History of Present Illness: Referral Source: Dr. Kelton Pillar History from: patient and [facility] chart Chief Complaint: Seizures  Todd Young is a 24 y.o. male who returns December 16, 2014, for the first time since August 23, 2014.  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  Absence seizures began at 14.  He experienced a generalized tonic-clonic seizure for the first time in five years on August 18, 2014.  This was witnessed and clear-cut.  [name] was initially treated with Keppra, which caused problems with temper and did not control his seizures up to 3000 mg a day.  Lamictal was started and he had cognitive issues and continued seizures.  Depakote was started and controlled his seizures with high dose Depakote and low-dose lamotrigine.  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  Low-dose lamotrigine was continued and Dilantin was started.  His most recent drug level was 21.5 mcg/mL.    He complains that he has some problems with memory for recall of the names and casual conversation.  This has not affected his school performance.  He has also felt somewhat shaky inside up to a couple of times a day.  He takes his Dilantin at nighttime, which in my opinion suggest that this is not related to his medication.  If he eats, his symptoms subside.  He has not had any nystagmoid eye movements or feelings of oscillation of the world.  His general health has been good.  He has gained about 2-1/2 pounds since he was last seen.  He is sleeping 8 hours at night.  He will attend [employer] to obtain a master's in Investment banker, operational.  This is a gap year, which will allow him time to prepare for MCATs and apply  to medical schools to enter in the fall of 2018.  There had been no further seizures since August 18, 2014.  Todd Young did not need refill of medications today and I will refill them.  Review of Systems: 12 system review was unremarkable  Past Medical History Diagnosis Date  . Seizures    Hospitalizations: No., Head Injury: No., Nervous System Infections: No., Immunizations up to date: No.  Onset of seizures when he was 14. These were absence. [place] worked well and brought the episodes under control. Initially, he had problems with temper, Keppra controlled his seizures and it was continued until his doses were escalated to 3000 mg a day at which time Lamictal was started. I discontinued Keppra and continued Lamictal monotherapy. Unfortunately, he had issues with cognition and continued seizures.  Lamictal was tapered and Depakote started. He did extremely well on low dose Lamictal and average doses of Depakote.  In late December, 2014 he developed pancreatitis on Depakote. I made a decision to place him on phenytoin to lessen the likelihood of generalized tonic-clonic seizures  He had an EEG in October 2009 that showed bifrontal sharp waves. He an MRI scan in January 2010 that was normal without with contrast.   Patient was seen in the ER Dept. at [facility] for high fever in Nov. 2012.  He started to have generalized tonic-clonic seizures and other episodes associated with unresponsive staring that suggested complex partial seizures.  He has experienced episodes of sensing the world oscillating back and  forth without any other alterations in awareness of the world or his ability to function. This typically happens when he has been looking in a computer for a long time or a movie.  These have been evaluated in detail, and I can find no evidence of seizure activity. Unfortunately, they are typically infrequent enough that we have not been able to study them with a prolonged video EEG  that would be definitive. I am unaware of any seizure-like behavior of it that could cause this form of oscillopsia. I think it is more likely that this represents a migraine variant. Even with that, I have no personal experience with that symptom as a manifestation of migraine variant.  When the patient was in [place], [place] in the summer of 2013, he had an episode of paraesthesia in his right arm that began in the forearm and spread to his fingers. He was unable to shake his arm to reestablish normal sensation. Shortly after that began, he had twitching of his thumb and index finger that lasted intermittently over 10 minutes. This did not persist, although the numbness did persist for about an hour.   The patient had been sleep deprived as I expected would occur because of meals that began at 10 p.m. and nights that often lasted until 2 or 3 in the morning. I strongly encouraged him to get enough sleep, although again I suspected that the prolonged nature of his sensory symptoms made this more likely that he had a migraine variant than a seizure.  In early September 2013 and again April 16, 2012, the patient had irregular twitching of his middle finger. He was able to capture this on video, which I reviewed. I was absolutely certain that this did not represent a focal motor seizure and conveyed that both to his mother and to [place].  ER visit on 08/18/14 due to seizure activity.  His last EEG was in October 2009, and showed bifrontal sharp waves. MRI scan in January 2010, was normal without contrast.  Birth History 8 lbs. infant born at [redacted] weeks gestational age to a 23 year old primigravida  Gestation was uncomplicated  Mother received normal spontaneous vaginal delivery  Nursery Course was uncomplicated  Growth and Development was recalled as normal  Behavior History none  Surgical History Procedure Laterality Date  . Wisdom tooth extraction  2011    x4   . Flexible sigmoidoscopy   12/06/2011    Procedure: FLEXIBLE SIGMOIDOSCOPY;  Surgeon: Milus Banister, MD;  Location: [facility];  Service: Endoscopy;  Laterality: N/A;   Family History family history is not on file. Family history is negative for migraines, seizures, intellectual disabilities, blindness, deafness, birth defects, chromosomal disorder, or autism.  Social History . Marital Status: Single    Spouse Name: N/A  . Number of Children: N/A  . Years of Education: N/A   Social History Main Topics  . Smoking status: Never Smoker   . Smokeless tobacco: Never Used  . Alcohol Use: Yes     Comment: Patient very rarely drinks alcohol.  . Drug Use: No  . Sexual Activity: No   Social History Narrative   Educational level: Grad. School   School Attending: [employer]  Occupation: Ship broker     Living with roommate   Hobbies/Interest: Lamario enjoys reading and exercising.  School comments: [place] is doing well in school.  Allergies Allergen Reactions  . Amoxicillin Other (See Comments)    Mouth inflamed and itchy  . Divalproex Sodium Other (See Comments)  Pancreatitis   Physical Exam BP 112/74 mmHg  Pulse 80  Ht 6\' 2"  (1.88 m)  Wt 181 lb 6.4 oz (82.283 kg)  BMI 23.28 kg/m2  General: alert, well developed, well nourished, in no acute distress, brown hair, blue eyes, left handed Head: normocephalic, no dysmorphic features Ears, Nose and Throat: Otoscopic: tympanic membranes normal; pharynx: oropharynx is pink without exudates or tonsillar hypertrophy Neck: supple, full range of motion, no cranial or cervical bruits Respiratory: auscultation clear Cardiovascular: no murmurs, pulses are normal Musculoskeletal: no skeletal deformities or apparent scoliosis Skin: no rashes or neurocutaneous lesions  Neurologic Exam  Mental Status: alert; oriented to person, place and year; knowledge is normal for age; language is normal Cranial Nerves: visual fields are full to double simultaneous stimuli;  extraocular movements are full and conjugate; pupils are round reactive to light; funduscopic examination shows sharp disc margins with normal vessels; symmetric facial strength; midline tongue and uvula; air conduction is greater than bone conduction bilaterally Motor: Normal strength, tone and mass; good fine motor movements; no pronator drift Sensory: intact responses to cold, vibration, proprioception and stereognosis Coordination: good finger-to-nose, rapid repetitive alternating movements and finger apposition Gait and Station: normal gait and station: patient is able to walk on heels, toes and tandem without difficulty; balance is adequate; Romberg exam is negative; Gower response is negative Reflexes: symmetric and diminished bilaterally; no clonus; bilateral flexor plantar responses  Assessment 1. Generalized convulsive epilepsy, G40.309. 2. Generalized nonconvulsive epilepsy, G40.309. 3. Partial epilepsy with impairment of consciousness, G40.209.  Discussion [name] has done relatively well with his seizure control.  I am pleased that he has not had any further seizures.  He is not having side effects that I can discern on 21.5 mcg/mL of Dilantin.  His mother was concerned about his symptoms, but he seems well and I do not think there is a significant problem at this time.  We will watch this carefully.  He knows that I will be happy to see him this fall if he is experiencing difficulty in school or has further seizures.  Plan He will return to see me in six months, during his Christmas break.  I spent 30 minutes of face-to-face time with [name], more than half of it in consultation.   Medication List   This list is accurate as of: 12/16/14  2:10 PM.       acetaminophen 500 MG tablet  Commonly known as:  TYLENOL  Take 500 mg by mouth every 6 (six) hours as needed. Fever/pain     DILANTIN 100 MG ER capsule  Generic drug:  phenytoin  Take 4 capsules by mouth at nighttime     ibuprofen  200 MG tablet  Commonly known as:  ADVIL,MOTRIN  Take 400 mg by mouth every 6 (six) hours as needed. Fever/pain     LAMICTAL 25 MG tablet  Generic drug:  lamoTRIgine  TAKE 1 TABLET BY MOUTH TWICE DAILY      The medication list was reviewed and reconciled. All changes or newly prescribed medications were explained.  A complete medication list was provided to the patient/caregiver.  Jodi Geralds MD

## 2015-02-23 IMAGING — CT CT ABDOMEN W/ CM
3 series · 13 of 36 positions shown, 19 images · IV contrast (OMNI 300/WATER & [ID] OMNI 300)
Comparison: None.

CLINICAL DATA: Epigastric abdominal pain, nausea.

EXAM:
CT ABDOMEN WITH CONTRAST
TECHNIQUE: Multidetector CT imaging of the abdomen was performed using the
standard protocol following bolus administration of intravenous
contrast.
CONTRAST:  100mL OMNIPAQUE IOHEXOL 300 MG/ML  SOLN

[Series 3: abdomen with · axial · 0.66mm/px · z∈[-288,-43]mm · 8 of 64 slices shown, 13 images]
[im 8/64  soft-tissue]
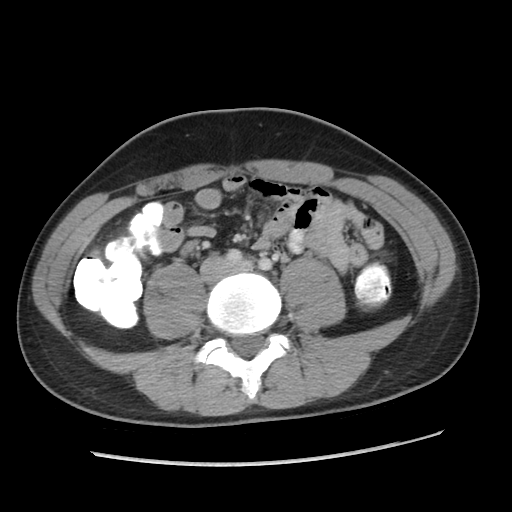
[im 8/64  bone]
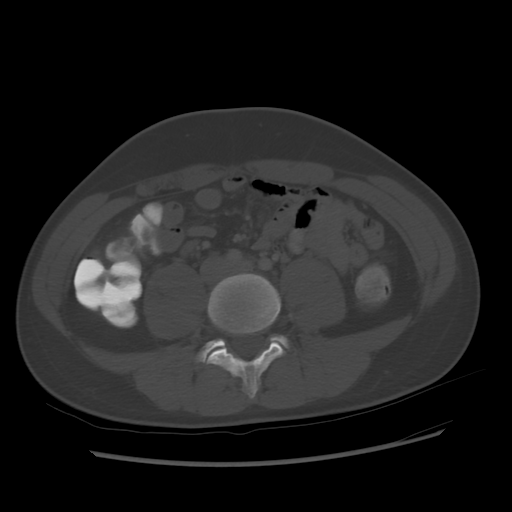
[im 15/64  soft-tissue]
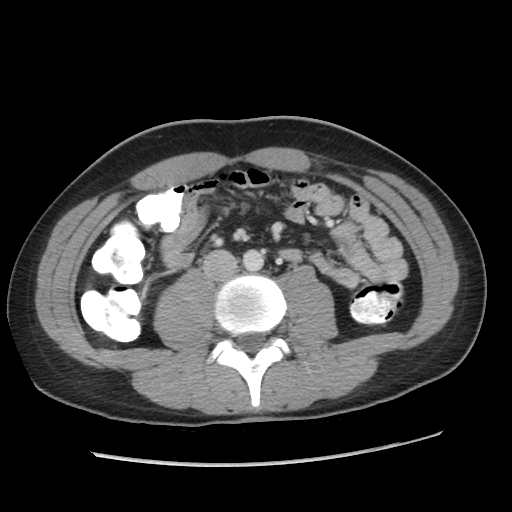
[im 22/64  soft-tissue]
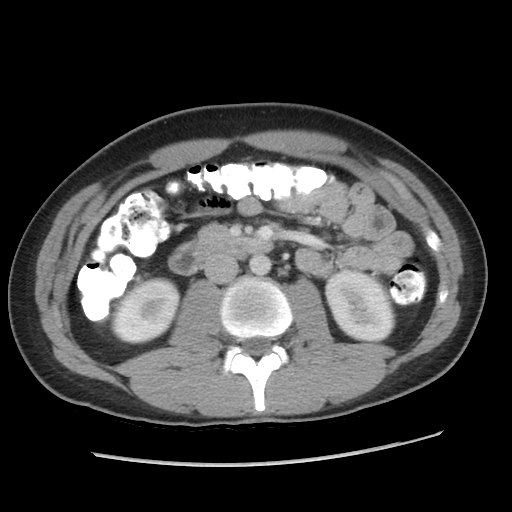
[im 29/64  soft-tissue]
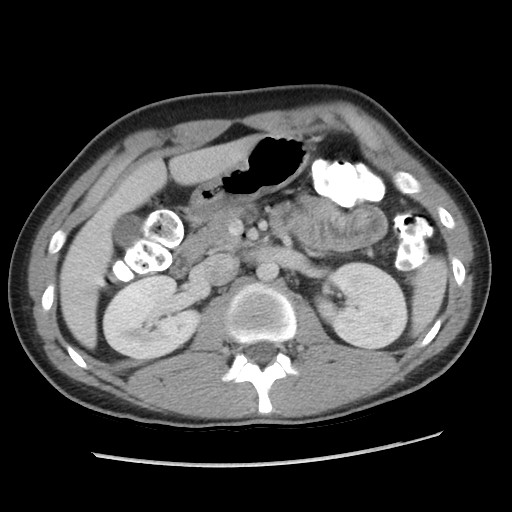
[im 36/64  soft-tissue]
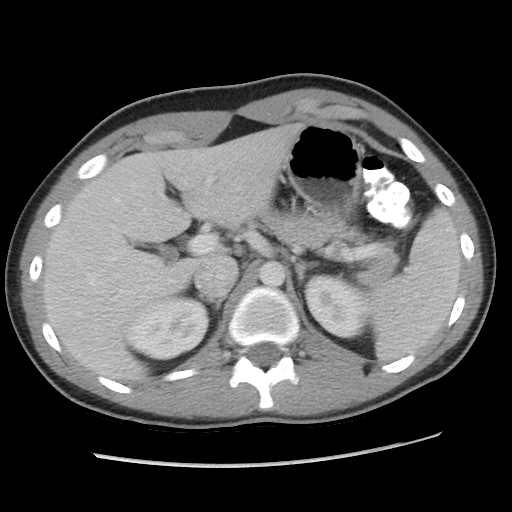
[im 36/64  lung]
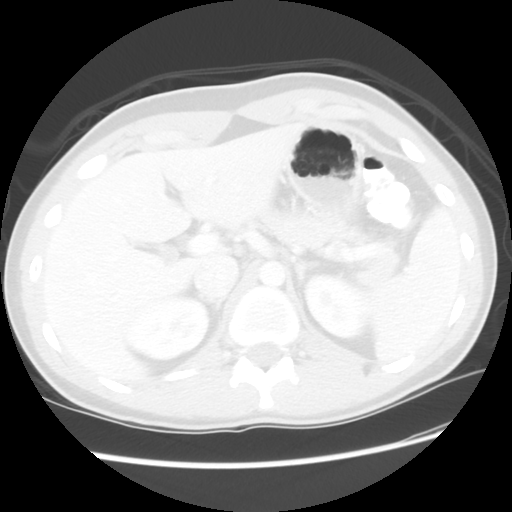
[im 43/64  soft-tissue]
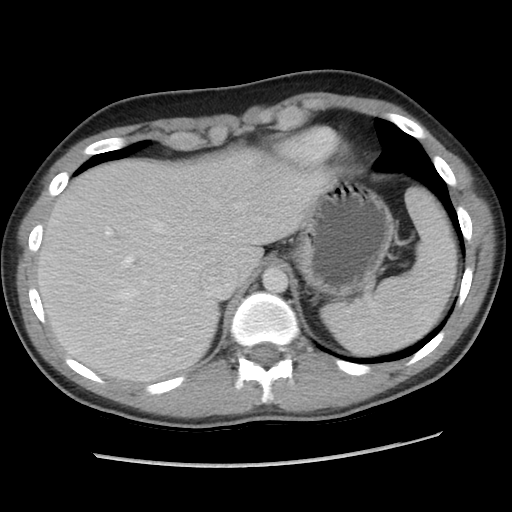
[im 43/64  lung]
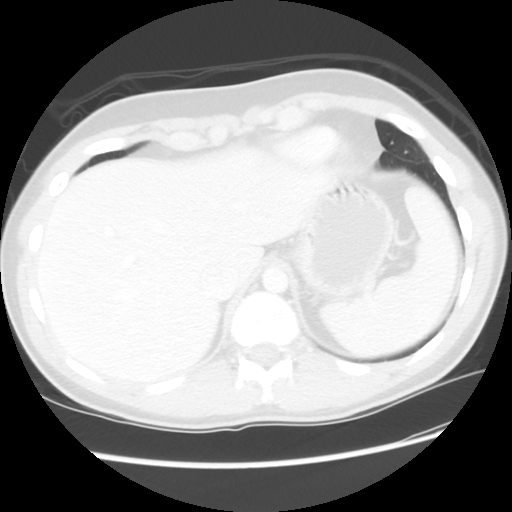
[im 50/64  soft-tissue]
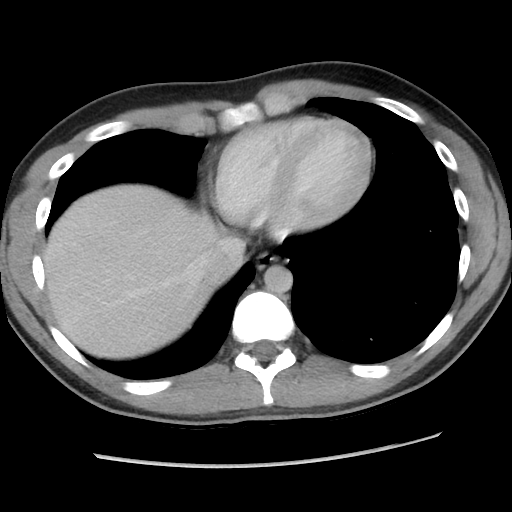
[im 50/64  lung]
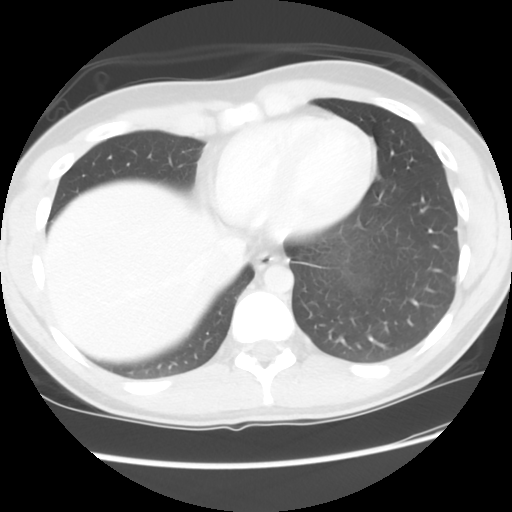
[im 57/64  soft-tissue]
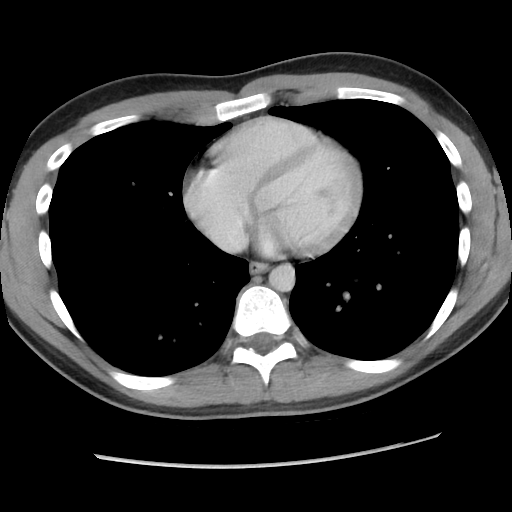
[im 57/64  lung]
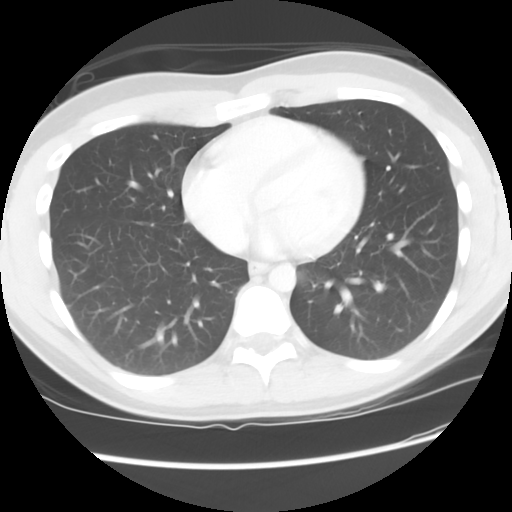

[Series 601: coronal body · coronal · 0.66mm/px · 1 of 105 slices shown, 2 images]
[im 35/105  soft-tissue]
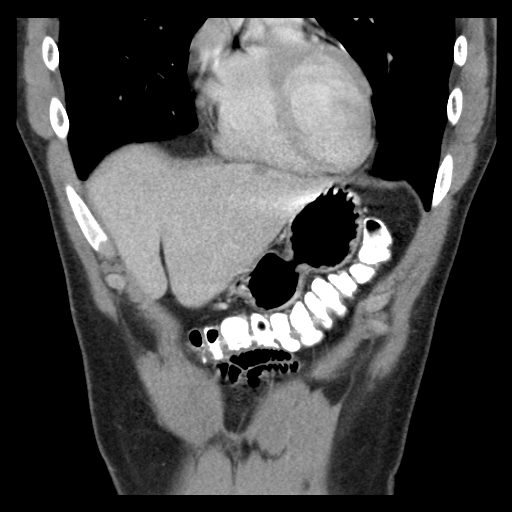
[im 35/105  bone]
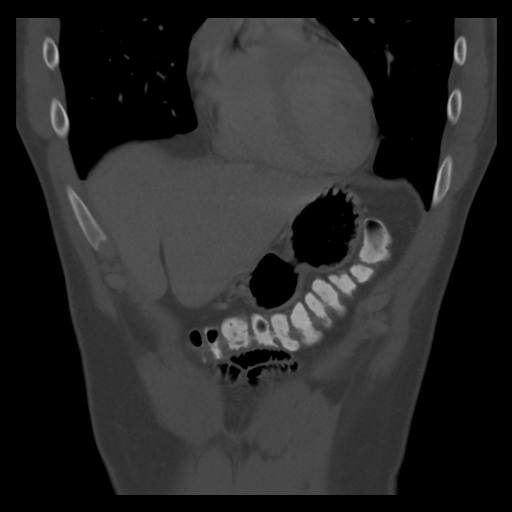

[Series 602: sagittal body · sagittal · 0.66mm/px · 4 of 137 slices shown]
[im 13/137  soft-tissue]
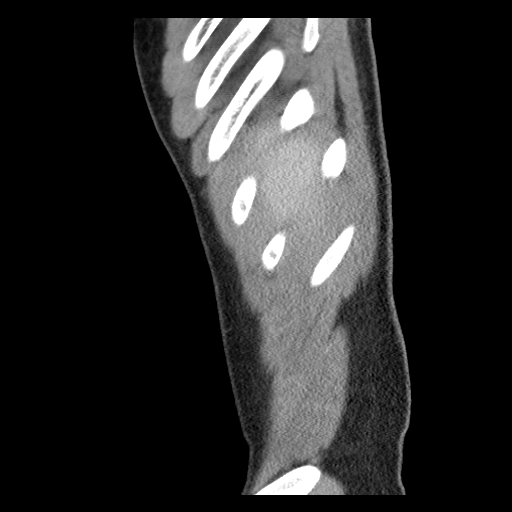
[im 26/137  soft-tissue]
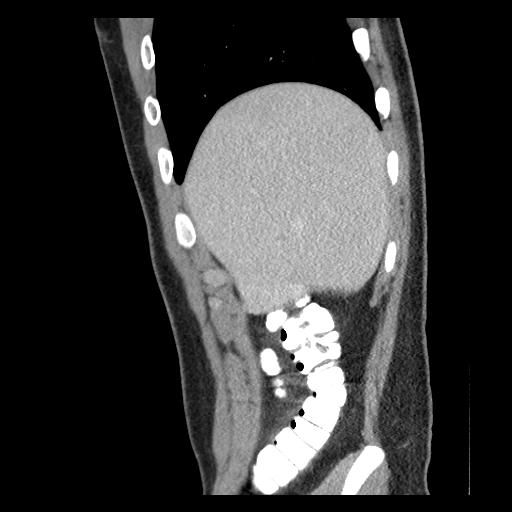
[im 46/137  soft-tissue]
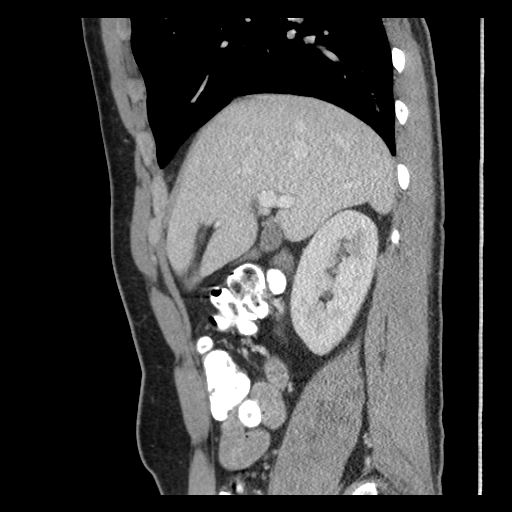
[im 59/137  soft-tissue]
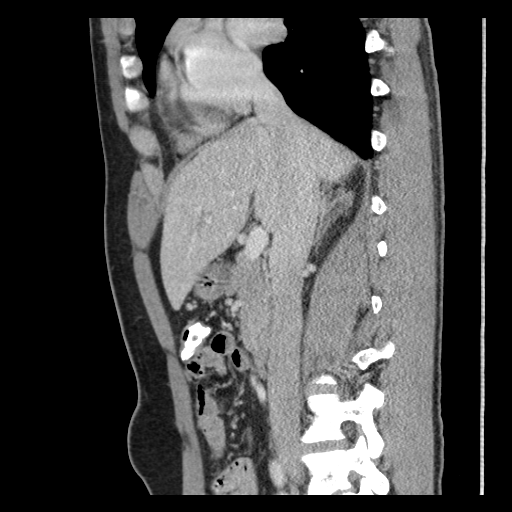

[13 of 36 positions shown; findings below may reference images not displayed]

FINDINGS: Lung bases are clear.

Liver, spleen, and adrenal glands are within normal limits.

Very mild peripancreatic stranding is suspected along the pancreatic
tail (coronal image 58). Pancreas is otherwise within normal limits.
No pancreatic ductal dilatation. No drainable fluid
collection/pseudocyst.

Layering sludge and/or noncalcified gallstones are possible (coronal
image 53), equivocal. Gallbladder is otherwise unremarkable by CT.
No intrahepatic or extrahepatic ductal dilatation.

Kidneys are within normal limits.  No hydronephrosis.

No abdominal ascites.

No suspicious abdominal lymphadenopathy.

Visualized osseous structures are within normal limits.
IMPRESSION: Very mild peripancreatic stranding is suspected along the pancreatic
tail. Correlate with laboratory evaluation for acute pancreatitis.
No drainable fluid collection/pseudocyst.

Possible layering gallbladder sludge and/or noncalcified gallstones,
equivocal. No intrahepatic or extrahepatic ductal dilatation.

Please note that the pelvis was not imaged.

## 2015-04-04 ENCOUNTER — Other Ambulatory Visit: Payer: Self-pay

## 2015-04-04 ENCOUNTER — Telehealth: Payer: Self-pay

## 2015-04-04 MED ORDER — LAMICTAL 25 MG PO TABS: 25.0000 mg | ORAL_TABLET | Freq: Two times a day (BID) | ORAL | Status: DC

## 2015-04-04 NOTE — Telephone Encounter (Signed)
I called mother and let her know the Rx was sent to [facility].

## 2015-04-04 NOTE — Telephone Encounter (Signed)
Angela, mom, lvm stating that patient is away at college. She is aware the [facility] sent over refill request, however, she is asking that an emergency Rx be called into [place] outpatient pharmacy for 4 pills. She said that she is going to pay out of pocket for the medication. [employer] [facility] phone number is: [phone].  I will call mother once Rx has been sent. Her call back # : [phone].

## 2015-04-04 NOTE — Telephone Encounter (Signed)
Please let Mom know that the Rx has been sent to [facility]. Thanks, TG

## 2015-04-14 ENCOUNTER — Telehealth: Payer: Self-pay | Admitting: *Deleted

## 2015-04-14 NOTE — Telephone Encounter (Signed)
I agree with information given to patient. Faby, if you receive the DMV forms, give them to me to complete. Thanks, Otila Kluver

## 2015-04-14 NOTE — Telephone Encounter (Signed)
Todd Young called wanting to check with you about driving privileges. Called DMV and they said it was up to Dr. Gaynell Face since last seizure was 08/18/2014, will send forms to [place] for [facility].   Questions: [phone].   Class until 5, leave message if there is no answer.

## 2015-04-14 NOTE — Telephone Encounter (Signed)
Called and left a voicemail for Todd Young letting him know that [place] will be out of the office until Monday. I let him know that we will wait for forms and fax them back to where they need to go and I will call him once that is complete. I invited him to call back with further questions.

## 2015-06-10 ENCOUNTER — Telehealth: Payer: Self-pay

## 2015-06-10 ENCOUNTER — Ambulatory Visit (INDEPENDENT_AMBULATORY_CARE_PROVIDER_SITE_OTHER): Payer: 59 | Admitting: Pediatrics

## 2015-06-10 ENCOUNTER — Encounter: Payer: Self-pay | Admitting: Pediatrics

## 2015-06-10 VITALS — BP 110/72 | HR 72 | Ht 73.75 in | Wt 177.6 lb

## 2015-06-10 DIAGNOSIS — G40309 Generalized idiopathic epilepsy and epileptic syndromes, not intractable, without status epilepticus: Secondary | ICD-10-CM | POA: Diagnosis not present

## 2015-06-10 DIAGNOSIS — H5310 Unspecified subjective visual disturbances: Secondary | ICD-10-CM | POA: Diagnosis not present

## 2015-06-10 MED ORDER — LAMICTAL 25 MG PO TABS: 25.0000 mg | ORAL_TABLET | Freq: Two times a day (BID) | ORAL | Status: DC

## 2015-06-10 MED ORDER — DILANTIN 100 MG PO CAPS: ORAL_CAPSULE | ORAL | Status: DC

## 2015-06-10 NOTE — Progress Notes (Signed)
Patient: Todd Young MRN: [id number] Sex: male DOB: 11/03/1990  Provider: Jodi Geralds, MD Location of Care: [facility]  Note type: Routine return visit  History of Present Illness: Referral Source: Dr. Kelton Pillar History from: patient Chief Complaint: Seizures  Todd Young is a 24 y.o. male who returns June 10, 2015 for the first time since December 16, 2014.  He has juvenile absence epilepsy and has experienced generalized tonic-clonic and absence seizures.  His last seizure occurred while he was in the gymnasium on August 18, 2014.  His medications included Keppra which caused problems with temper and did not control his seizures up to 3000 g per day.  Lamictal caused cognitive issues and did not prevent his seizures.  Depakote controlled his seizures with high-dose Depakote and low-dose lamotrigine.  Unfortunately he developed Depakote-induced pancreatitis a couple of years ago and it had to be discontinued.  I kept him on lamotrigine and started Dilantin.  His most recent drug levels 21.5 mcg/mL.  The daytime seizure caused him to lose his driver's license.  We filed a request to reinstate his license in June.  He sent information in late October, but I have no record that we ever received it and did not act upon it.  It is not scanned into our chart.  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  He takes and tolerates Dilantin (trade drug) without side effects.  He also takes low-dose  trade drug Lamictal.  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  He intends to take a gap year and work before applying to enter medical school in the fall of 2018.  He tells me that he wants to become a neurologist.  His only other medical problem is that he continues to have brief episodes of oscillation of the world back and forth without any alteration of awareness or  vertigo.  This typically happens when he is been using his eyes while reading or looking at a computer or movie.  The episodes last for about 2 minutes.  He has not expressed any other symptoms of numbness or tingling dizziness.  He is also not experienced any twitching of his digits.  His appetite is good.  He's lost 4 pounds.  He was surprised because he says he eats "nothing but junk".  He is sleeping well and maintaining a good work/life balance.  He feels that he is finally regained his ability to study and is apparently doing very well in his program at [employer].  He hopes that this will enhance his ability to be accepted to medical school.  Review of Systems: 12 system review was unremarkable  Past Medical History Diagnosis Date  . Seizures ([place])    Hospitalizations: No., Head Injury: No., Nervous System Infections: No., Immunizations up to date: Yes.    Onset of seizures when he was 14. These were absence. [place] worked well and brought the episodes under control. Initially, he had problems with temper, Keppra controlled his seizures and it was continued until his doses were escalated to 3000 mg a day at which time Lamictal was started. I discontinued Keppra and continued Lamictal monotherapy. Unfortunately, he had issues with cognition and continued seizures.  Lamictal was tapered and Depakote started. He did extremely well on low dose Lamictal and average doses of Depakote.  In late December, 2014 he developed pancreatitis on Depakote. I made a  decision to place him on phenytoin to lessen the likelihood of generalized tonic-clonic seizures  He had an EEG in October 2009 that showed bifrontal sharp waves. He an MRI scan in January 2010 that was normal without with contrast.   Patient was seen in the ER Dept. at [facility] for high fever in Nov. 2012.  He started to have generalized tonic-clonic seizures and other episodes associated with unresponsive staring that suggested complex  partial seizures.  He has experienced episodes of sensing the world oscillating back and forth without any other alterations in awareness of the world or his ability to function. This typically happens when he has been looking in a computer for a long time or a movie.  These have been evaluated in detail, and I can find no evidence of seizure activity. Unfortunately, they are typically infrequent enough that we have not been able to study them with a prolonged video EEG that would be definitive. I am unaware of any seizure-like behavior of it that could cause this form of oscillopsia. I think it is more likely that this represents a migraine variant. Even with that, I have no personal experience with that symptom as a manifestation of migraine variant.  When the patient was in [place] in the summer of 2013, he had an episode of paraesthesia in his right arm that began in the forearm and spread to his fingers. He was unable to shake his arm to reestablish normal sensation. Shortly after that began, he had twitching of his thumb and index finger that lasted intermittently over 10 minutes. This did not persist, although the numbness did persist for about an hour.   The patient had been sleep deprived as I expected would occur because of meals that began at 10 p.m. and nights that often lasted until 2 or 3 in the morning. I strongly encouraged him to get enough sleep, although again I suspected that the prolonged nature of his sensory symptoms made this more likely that he had a migraine variant than a seizure.  In early September 2013 and again April 16, 2012, the patient had irregular twitching of his middle finger. He was able to capture this on video, which I reviewed. I was absolutely certain that this did not represent a focal motor seizure and conveyed that both to his mother and to [place].  ER visit on 08/18/14 due to seizure activity.  His last EEG was in October 2009, and showed  bifrontal sharp waves. MRI scan in January 2010, was normal without contrast.  Birth History 8 lbs. infant born at [redacted] weeks gestational age to a 23 year old primigravida  Gestation was uncomplicated  Mother received normal spontaneous vaginal delivery  Nursery Course was uncomplicated  Growth and Development was recalled as normal  Behavior History none  Surgical History Procedure Laterality Date  . Wisdom tooth extraction  2011    x4   . Flexible sigmoidoscopy  12/06/2011    Procedure: FLEXIBLE SIGMOIDOSCOPY;  Surgeon: Milus Banister, MD;  Location: [facility];  Service: Endoscopy;  Laterality: N/A;   Family History family history is not on file. Family history is negative for migraines, seizures, intellectual disabilities, blindness, deafness, birth defects, chromosomal disorder, or autism.  Social History . Marital Status: Single    Spouse Name: N/A  . Number of Children: N/A  . Years of Education: N/A   Social History Main Topics  . Smoking status: Never Smoker   . Smokeless tobacco: Never Used  . Alcohol  Use: Yes     Comment: Patient very rarely drinks alcohol.  . Drug Use: No  . 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 He lives alone. He enjoys exercising and reading.    Allergies Allergen Reactions  . Amoxicillin Other (See Comments)    Mouth inflamed and itchy  . Divalproex Sodium Other (See Comments)    Pancreatitis   Physical Exam BP 110/72 mmHg  Pulse 72  Ht 6' 1.75" (1.873 m)  Wt 177 lb 9.6 oz (80.559 kg)  BMI 22.96 kg/m2  General: alert, well developed, well nourished, in no acute distress, brown hair, brown eyes, right handed Head: normocephalic, no dysmorphic features Ears, Nose and Throat: Otoscopic: tympanic membranes normal; pharynx: oropharynx is pink without exudates or tonsillar hypertrophy Neck: supple, full range of motion, no cranial or cervical bruits Respiratory: auscultation  clear Cardiovascular: no murmurs, pulses are normal Musculoskeletal: no skeletal deformities or apparent scoliosis Skin: no rashes or neurocutaneous lesions  Neurologic Exam  Mental Status: alert; oriented to person, place and year; knowledge is normal for age; language is normal Cranial Nerves: visual fields are full to double simultaneous stimuli; extraocular movements are full and conjugate; pupils are round reactive to light; funduscopic examination shows sharp disc margins with normal vessels; symmetric facial strength; midline tongue and uvula; air conduction is greater than bone conduction bilaterally Motor: Normal strength, tone and mass; good fine motor movements; no pronator drift Sensory: intact responses to cold, vibration, proprioception and stereognosis Coordination: good finger-to-nose, rapid repetitive alternating movements and finger apposition Gait and Station: normal gait and station: patient is able to walk on heels, toes and tandem without difficulty; balance is adequate; Romberg exam is negative; Gower response is negative Reflexes: symmetric and diminished bilaterally; no clonus; bilateral flexor plantar responses  Assessment 1.  Generalized convulsive epilepsy, G40.309. 2.  Generalized nonconvulsive epilepsy, G40.309. 3.  Subjective visual disturbances, H53.10.  Discussion I the past, I thought that he also had episodes of complex partial seizures because of confusional states following unresponsive staring.  This has not happened in years.  I think that he has a variant of juvenile absence epilepsy.  I'm pleased that his seizures are in good control.  He seems to have no significant problems with Dilantin including gingival hyperplasia rash or unsteady gait.  Plan  I refilled prescriptions for Lamictal and Dilantin.  He will return to see me in 6 months time.  I spent 30 minutes of face-to-face time with Todd Young, more than half of it in consultation.   Medication List        This list is accurate as of: 06/10/15  9:40 AM.       acetaminophen 500 MG tablet  Commonly known as:  TYLENOL  Take 500 mg by mouth every 6 (six) hours as needed. Fever/pain     DILANTIN 100 MG ER capsule  Generic drug:  phenytoin  Take 4 capsules by mouth at nighttime     ibuprofen 200 MG tablet  Commonly known as:  ADVIL,MOTRIN  Take 400 mg by mouth every 6 (six) hours as needed. Fever/pain     LAMICTAL 25 MG tablet  Generic drug:  lamoTRIgine  Take 1 tablet (25 mg total) by mouth 2 (two) times daily.      The medication list was reviewed and reconciled. All changes or newly prescribed medications were explained.  A complete medication list was provided to the patient/caregiver.  Gwyndolyn Saxon  Lewis Shock MD

## 2015-06-10 NOTE — Telephone Encounter (Signed)
Gave "[name]" a copy of the [facility] Forms that were completed by Dr. Gaynell Face at today's office visit. I faxed the forms to the [facility] P# [phone], F# Fax: [phone]. Mailed original to [place]: [address], [place] , [place] [address]. I included customer # on each page of the forms. Placed copies at front desk for scanning.

## 2015-06-22 MED FILL — LaMICtal 25 MG TABS: 25 | 90 days supply | Qty: 180 | Fill #0

## 2015-07-18 DIAGNOSIS — B081 Molluscum contagiosum: Secondary | ICD-10-CM | POA: Diagnosis not present

## 2015-08-24 ENCOUNTER — Telehealth: Payer: Self-pay

## 2015-08-24 NOTE — Telephone Encounter (Signed)
Patient called stating that he is applying for more time with [employer] and he is requesting a letter to be written. Patient relates that he needs the letter to tell what he was diagnosed with and his treatment as well. He requesting a call back so he can give more details.  CB: [phone]

## 2015-08-30 NOTE — Telephone Encounter (Signed)
I called Mom, Todd Young to let her know that the letter was ready to be picked up. TG

## 2015-08-30 NOTE — Telephone Encounter (Signed)
Noted, thank you

## 2015-09-01 ENCOUNTER — Other Ambulatory Visit: Payer: Self-pay | Admitting: Pediatrics

## 2015-09-05 MED FILL — DILANTIN 100 MG CAPSULE: 100 | 90 days supply | Qty: 360 | Fill #0

## 2015-10-06 MED FILL — LaMICtal 25 MG TABS: 25 | 90 days supply | Qty: 180 | Fill #1

## 2015-12-05 MED FILL — DILANTIN 100 MG CAPSULE: 100 | 90 days supply | Qty: 360 | Fill #1

## 2015-12-28 ENCOUNTER — Ambulatory Visit: Payer: 59 | Admitting: Pediatrics

## 2016-01-19 MED FILL — LaMICtal 25 MG TABS: 25 | 90 days supply | Qty: 180 | Fill #2

## 2016-01-27 ENCOUNTER — Ambulatory Visit (INDEPENDENT_AMBULATORY_CARE_PROVIDER_SITE_OTHER): Payer: 59 | Admitting: Pediatrics

## 2016-01-27 ENCOUNTER — Encounter: Payer: Self-pay | Admitting: Pediatrics

## 2016-01-27 VITALS — BP 104/68 | HR 88 | Ht 73.75 in | Wt 178.6 lb

## 2016-01-27 DIAGNOSIS — G40309 Generalized idiopathic epilepsy and epileptic syndromes, not intractable, without status epilepticus: Secondary | ICD-10-CM

## 2016-01-27 NOTE — Progress Notes (Signed)
Patient: Todd Young MRN: [id number] Sex: male DOB: September 15, 1990  Provider: Jodi Geralds, MD Location of Care: [facility]  Note type: Routine return visit  History of Present Illness: Referral Source: Kelton Pillar, MD History from: patient and [facility] chart Chief Complaint: Generalized convulsive epilepsy  Todd Young is a 25 y.o. male who returns on January 27, 2016, for the first time since June 10, 2015.  He has juvenile absence epilepsy and is experienced absence and generalized tonic-clonic seizures.  There was a time when I thought he might have complex partial seizures.  He had a daytime seizure on August 18, 2014.  He completed a year of being seizure-free and is now driving again.  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  There are five inpatient trials.  He works three days a week.  He works part-time as a Psychologist, occupational with chart based study, also at [employer].  His health is good.  There have been no seizures.  There are no visual oscillations.  He is getting adequate sleep and he is least stressed that I have seen him.  He is applying to at least 13 schools and perhaps more.  He is hoping to matriculate in the late summer of 2018.  Review of Systems: 12 system review was assessed and was negative  Past Medical History Diagnosis Date  . Seizures ([place])    Hospitalizations: No., Head Injury: No., Nervous System Infections: No., Immunizations up to date: Yes.    Onset of seizures when he was 14. These were absence. [place] worked well and brought the episodes under control. Initially, he had problems with temper, Keppra controlled his seizures and it was continued until his doses were escalated to 3000 mg a day at which time Lamictal was started. I discontinued Keppra and continued Lamictal monotherapy. Unfortunately, he  had issues with cognition and continued seizures.  Lamictal was tapered and Depakote started. He did extremely well on low dose Lamictal and average doses of Depakote.  In late December, 2014 he developed pancreatitis on Depakote. I made a decision to place him on phenytoin to lessen the likelihood of generalized tonic-clonic seizures  He had an EEG in October 2009 that showed bifrontal sharp waves. He an MRI scan in January 2010 that was normal without with contrast.   Patient was seen in the ER Dept. at [facility] for high fever in Nov. 2012.  He started to have generalized tonic-clonic seizures and other episodes associated with unresponsive staring that suggested complex partial seizures.  He has experienced episodes of sensing the world oscillating back and forth without any other alterations in awareness of the world or his ability to function. This typically happens when he has been looking in a computer for a long time or a movie.  These have been evaluated in detail, and I can find no evidence of seizure activity. Unfortunately, they are typically infrequent enough that we have not been able to study them with a prolonged video EEG that would be definitive. I am unaware of any seizure-like behavior of it that could cause this form of oscillopsia. I think it is more likely that this represents a migraine variant. Even with that, I have no personal experience with that symptom as a manifestation of migraine variant.  When the patient was in [place] in the summer of 2013,  he had an episode of paraesthesia in his right arm that began in the forearm and spread to his fingers. He was unable to shake his arm to reestablish normal sensation. Shortly after that began, he had twitching of his thumb and index finger that lasted intermittently over 10 minutes. This did not persist, although the numbness did persist for about an hour.   The patient had been sleep deprived as I  expected would occur because of meals that began at 10 p.m. and nights that often lasted until 2 or 3 in the morning. I strongly encouraged him to get enough sleep, although again I suspected that the prolonged nature of his sensory symptoms made this more likely that he had a migraine variant than a seizure.  In early September 2013 and again April 16, 2012, the patient had irregular twitching of his middle finger. He was able to capture this on video, which I reviewed. I was absolutely certain that this did not represent a focal motor seizure and conveyed that both to his mother and to [place].  ER visit on 08/18/14 due to seizure activity.  His last EEG was in October 2009, and showed bifrontal sharp waves. MRI scan in January 2010, was normal without contrast.  Birth History 8 lbs. infant born at [redacted] weeks gestational age to a 24 year old primigravida  Gestation was uncomplicated  Mother received normal spontaneous vaginal delivery  Nursery Course was uncomplicated  Growth and Development was recalled as normal  Behavior History none  Surgical History Procedure Laterality Date  . FLEXIBLE SIGMOIDOSCOPY  12/06/2011   Procedure: FLEXIBLE SIGMOIDOSCOPY;  Surgeon: Milus Banister, MD;  Location: [facility];  Service: Endoscopy;  Laterality: N/A;  . WISDOM TOOTH EXTRACTION  2011   x4    Family History family history is not on file. Family history is negative for migraines, seizures, intellectual disabilities, blindness, deafness, birth defects, chromosomal disorder, or autism.  Social History . Marital status: Single    Spouse name: N/A  . Number of children: N/A  . Years of education: N/A   Social History Main Topics  . Smoking status: Never Smoker  . Smokeless tobacco: Never Used  . Alcohol use Yes     Comment: Patient very rarely drinks alcohol.  . Drug use: No  . 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  He enjoys exercising and reading.    Allergies Allergen Reactions  . Amoxicillin Other (See Comments)    Mouth inflamed and itchy  . Divalproex Sodium Other (See Comments)    Pancreatitis   Physical Exam BP 104/68   Pulse 88   Ht 6' 1.75" (1.873 m)   Wt 178 lb 9.6 oz (81 kg)   BMI 23.09 kg/m   General: alert, well developed, well nourished, in no acute distress, brown hair, brown eyes, right handed Head: normocephalic, no dysmorphic features Ears, Nose and Throat: Otoscopic: tympanic membranes normal; pharynx: oropharynx is pink without exudates or tonsillar hypertrophy Neck: supple, full range of motion, no cranial or cervical bruits Respiratory: auscultation clear Cardiovascular: no murmurs, pulses are normal Musculoskeletal: no skeletal deformities or apparent scoliosis Skin: no rashes or neurocutaneous lesions  Neurologic Exam  Mental Status: alert; oriented to person, place and year; knowledge is normal for age; language is normal Cranial Nerves: visual fields are full to double simultaneous stimuli; extraocular movements are full and conjugate; pupils are round reactive to light; funduscopic examination  shows sharp disc margins with normal vessels; symmetric facial strength; midline tongue and uvula; air conduction is greater than bone conduction bilaterally Motor: Normal strength, tone and mass; good fine motor movements; no pronator drift Sensory: intact responses to cold, vibration, proprioception and stereognosis Coordination: good finger-to-nose, rapid repetitive alternating movements and finger apposition Gait and Station: normal gait and station: patient is able to walk on heels, toes and tandem without difficulty; balance is adequate; Romberg exam is negative; Gower response is negative Reflexes: symmetric and diminished bilaterally; no clonus; bilateral flexor plantar responses  Assessment 1. Generalized convulsive epilepsy, G40.309. 2. Generalized nonconvulsive  epilepsy, G40.309.  Discussion I am pleased that Todd Young is doing well.  There is no need to change his medication.  He will return to see me in six months' time.  He has 90-day prescription refills for both antiepileptic medications.  Plan I spent 30 minutes of face-to-face time with Todd Young.   Medication List   Accurate as of 01/27/16 11:59 AM.      acetaminophen 500 MG tablet Commonly known as:  TYLENOL Take 500 mg by mouth every 6 (six) hours as needed. Fever/pain   DILANTIN 100 MG ER capsule Generic drug:  phenytoin TAKE 4 CAPSULES BY MOUTH AT NIGHTTIME   ibuprofen 200 MG tablet Commonly known as:  ADVIL,MOTRIN Take 400 mg by mouth every 6 (six) hours as needed. Fever/pain   LAMICTAL 25 MG tablet Generic drug:  lamoTRIgine Take 1 tablet (25 mg total) by mouth 2 (two) times daily.     The medication list was reviewed and reconciled. All changes or newly prescribed medications were explained.  A complete medication list was provided to the patient/caregiver.  Jodi Geralds MD

## 2016-03-06 MED FILL — DILANTIN 100 MG CAPSULE: 100 | 90 days supply | Qty: 360 | Fill #2

## 2016-04-05 ENCOUNTER — Other Ambulatory Visit (INDEPENDENT_AMBULATORY_CARE_PROVIDER_SITE_OTHER): Payer: Self-pay | Admitting: Family

## 2016-04-05 DIAGNOSIS — G40309 Generalized idiopathic epilepsy and epileptic syndromes, not intractable, without status epilepticus: Secondary | ICD-10-CM

## 2016-04-05 MED ORDER — LAMICTAL 25 MG PO TABS: 25.0000 mg | ORAL_TABLET | Freq: Two times a day (BID) | ORAL | 3 refills | Status: DC

## 2016-04-09 MED FILL — LaMICtal 25 MG TABS: 25 | 90 days supply | Qty: 180 | Fill #0

## 2016-05-31 MED FILL — DILANTIN 100 MG CAPSULE: 100 | 90 days supply | Qty: 360 | Fill #3

## 2016-07-11 MED FILL — LaMICtal 25 MG TABS: 25 | 90 days supply | Qty: 180 | Fill #1

## 2016-07-12 DIAGNOSIS — L700 Acne vulgaris: Secondary | ICD-10-CM | POA: Diagnosis not present

## 2016-07-12 DIAGNOSIS — D225 Melanocytic nevi of trunk: Secondary | ICD-10-CM | POA: Diagnosis not present

## 2016-07-12 DIAGNOSIS — L723 Sebaceous cyst: Secondary | ICD-10-CM | POA: Diagnosis not present

## 2016-07-12 DIAGNOSIS — D485 Neoplasm of uncertain behavior of skin: Secondary | ICD-10-CM | POA: Diagnosis not present

## 2016-07-24 MED FILL — TRETINOIN 0.025% CREAM: 0.025 | 90 days supply | Qty: 45 | Fill #0

## 2016-08-29 ENCOUNTER — Other Ambulatory Visit: Payer: Self-pay | Admitting: Family

## 2016-08-29 MED FILL — DILANTIN 100 MG CAPSULE: 100 | 90 days supply | Qty: 360 | Fill #0

## 2016-09-04 ENCOUNTER — Telehealth (INDEPENDENT_AMBULATORY_CARE_PROVIDER_SITE_OTHER): Payer: Self-pay | Admitting: *Deleted

## 2016-09-04 NOTE — Telephone Encounter (Signed)
Mother, Levada Dy, dropped off DMV forms to be completed.  Please mail the forms to the [facility] upon completion.  Also, please mail a copy to the mother for her records.  Address is complete on file.  Forms have been placed on Tina's desk.

## 2016-09-05 ENCOUNTER — Encounter (INDEPENDENT_AMBULATORY_CARE_PROVIDER_SITE_OTHER): Payer: Self-pay | Admitting: *Deleted

## 2016-09-12 ENCOUNTER — Encounter (INDEPENDENT_AMBULATORY_CARE_PROVIDER_SITE_OTHER): Payer: Self-pay | Admitting: Pediatrics

## 2016-09-24 NOTE — Telephone Encounter (Signed)
Mom called today and said that the [facility] sent a letter to them that the [facility] form had not been received. The DMV form was mailed on March 22nd. I faxed a copy to the [facility] today. TG

## 2016-10-10 MED FILL — LaMICtal 25 MG TABS: 25 | 90 days supply | Qty: 180 | Fill #2

## 2016-11-14 ENCOUNTER — Ambulatory Visit (INDEPENDENT_AMBULATORY_CARE_PROVIDER_SITE_OTHER): Payer: 59 | Admitting: Pediatrics

## 2016-11-14 ENCOUNTER — Encounter (INDEPENDENT_AMBULATORY_CARE_PROVIDER_SITE_OTHER): Payer: Self-pay | Admitting: Pediatrics

## 2016-11-14 VITALS — BP 100/74 | HR 72 | Ht 74.0 in | Wt 189.4 lb

## 2016-11-14 DIAGNOSIS — G40209 Localization-related (focal) (partial) symptomatic epilepsy and epileptic syndromes with complex partial seizures, not intractable, without status epilepticus: Secondary | ICD-10-CM

## 2016-11-14 DIAGNOSIS — G40309 Generalized idiopathic epilepsy and epileptic syndromes, not intractable, without status epilepticus: Secondary | ICD-10-CM | POA: Diagnosis not present

## 2016-11-14 NOTE — Patient Instructions (Signed)
I'm very proud of you.  You have been working for this for a number of years.  Make certain that you get enough sleep to pace yourself, try to get your work done before its due.  Please sign up for My Chart so that you can communicate with me if you need to.  I will let you know the results of your laboratory when they're available.

## 2016-11-14 NOTE — Assessment & Plan Note (Signed)
-   Stable. Without seizure activity for past 2 years. - Continue lamictal 25 mg daily. Will not obtain level since is low dose. - Continue dilantin 400 mg daily. Suggested taking this in split doses (200 mg in a.m. and 200 mg in p.m.) to avoid having to drink as much liquid at bedtime.  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 Reviewed importance of good sleep hygiene and completing assignments early/on time to avoid sleep deprivation.

## 2016-11-14 NOTE — Progress Notes (Signed)
Patient: Todd Young MRN: [id number] Sex: male DOB: 1990/11/22  Provider: Wyline Copas, MD Location of Care: [facility]  Note type: Routine return visit  History of Present Illness: Referral Source: Kelton Pillar, MD History from: patient and [facility] chart Chief Complaint: Generalized convulsive epilepsy  Todd Young is a 26 y.o. 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   Patient reports he has been doing well since his last OV 01/2016. He has not had any recent seizure activity that he has been aware of. He does complain of having to get up at night to urinate due to drinking a lot of water at bedtime when he takes dilantin (4 pills) and wonders if he could take this differently. He denies trouble taking his medication, which is still lamictal 25 mg in the morning and dilantin 100 mg 4 capsules at bedtime.   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 He'll be living with a roommate about a 20-minute drive from school. His dad suggested he get a job during his first year, but patient thinks he will hold off and instead adjust to the workload.   Patient's mother (not present) is interested in him having his medication levels checked. This was last done 01/2015, and dilantin level was slightly supratherapeutic at 21.9 micrograms per milliliter. Patient, however, denies vision problems or grogginess.   Patient's last seizure was in March 2016 while he was at the gym. He could not identify any triggers at that time; he was well rested and had not been drinking.   Keppra and depakote are medications he has tried in the past but had break-through seizures and pancreatitis on these, respectively.   Review of Systems: 12 system review was assessed and was  negative  Past Medical History Diagnosis Date  . Seizures ([place])    Hospitalizations: No., Head Injury: No., Nervous System Infections: No., Immunizations up to date: Yes.    Onset of seizures when he was 14. These were absence. [place] worked well and brought the episodes under control. Initially, he had problems with temper, Keppra controlled his seizures and it was continued until his doses were escalated to 3000 mg a day at which time Lamictal was started. I discontinued Keppra and continued Lamictal monotherapy. Unfortunately, he had issues with cognition and continued seizures.  Lamictal was tapered and Depakote started. He did extremely well on low dose Lamictal and average doses of Depakote.  In late December, 2014 he developed pancreatitis on Depakote. I made a decision to place him on phenytoin to lessen the likelihood of generalized tonic-clonic seizures  He had an EEG in October 2009 that showed bifrontal sharp waves. He an MRI scan in January 2010 that was normal without with contrast.   Patient was seen in the ER Dept. at [facility] for high fever in Nov. 2012.  He started to have generalized tonic-clonic seizures and other episodes associated with unresponsive staring that suggested complex partial seizures.  He has experienced episodes of sensing the world oscillating back and forth without any other alterations in awareness of the world or his ability to function. This typically happens when he has been looking in a computer for a long time or a movie.  These have been evaluated in detail, and I can find no evidence of  seizure activity. Unfortunately, they are typically infrequent enough that we have not been able to study them with a prolonged video EEG that would be definitive. I am unaware of any seizure-like behavior of it that could cause this form of oscillopsia. I think it is more likely that this represents a migraine variant. Even with that, I have no personal  experience with that symptom as a manifestation of migraine variant.  When the patient was in [place], [place] in the summer of 2013, he had an episode of paraesthesia in his right arm that began in the forearm and spread to his fingers. He was unable to shake his arm to reestablish normal sensation. Shortly after that began, he had twitching of his thumb and index finger that lasted intermittently over 10 minutes. This did not persist, although the numbness did persist for about an hour.   The patient had been sleep deprived as I expected would occur because of meals that began at 10 p.m. and nights that often lasted until 2 or 3 in the morning. I strongly encouraged him to get enough sleep, although again I suspected that the prolonged nature of his sensory symptoms made this more likely that he had a migraine variant than a seizure.  In early September 2013 and again April 16, 2012, the patient had irregular twitching of his middle finger. He was able to capture this on video, which I reviewed. I was absolutely certain that this did not represent a focal motor seizure and conveyed that both to his mother and to [place].  ER visit on 08/18/14 due to seizure activity.  His last EEG was in October 2009, and showed bifrontal sharp waves. MRI scan in January 2010, was normal without contrast.  Birth History 8 lbs. infant born at [redacted] weeks gestational age to a 25 year old primigravida  Gestation was uncomplicated  Mother received normal spontaneous vaginal delivery  Nursery Course was uncomplicated  Growth and Development was recalled as normal  Behavior History none  Surgical History Procedure Laterality Date  . FLEXIBLE SIGMOIDOSCOPY  12/06/2011   Procedure: FLEXIBLE SIGMOIDOSCOPY;  Surgeon: Milus Banister, MD;  Location: [facility];  Service: Endoscopy;  Laterality: N/A;  . WISDOM TOOTH EXTRACTION  2011   x4    Family History family history is not on file. Family history is  negative for migraines, seizures, intellectual disabilities, blindness, deafness, birth defects, chromosomal disorder, or autism.  Social History . Marital status: Single  . Years of education: 43   Social History Main Topics  . Smoking status: Never Smoker  . Smokeless tobacco: Never Used  . Alcohol use Yes     Comment: Patient very rarely drinks alcohol.  . Drug use: No  . 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    He graduated from [employer].     He lives alone.     He enjoys exercising and reading.    Allergies Allergen Reactions  . Amoxicillin Other (See Comments)    Mouth inflamed and itchy  . Divalproex Sodium Other (See Comments)    Pancreatitis   Physical Exam BP 100/74   Pulse 72   Ht 6\' 2"  (1.88 m)   Wt 189 lb 6.4 oz (85.9 kg)   BMI 24.32 kg/m   General: alert, well developed, well nourished, in no acute distress, brown hair, brown eyes, right handed Head: normocephalic, no dysmorphic features Ears, Nose and Throat: Otoscopic: tympanic membranes normal; pharynx:  oropharynx is pink without exudates or tonsillar hypertrophy Neck: supple, full range of motion, no cranial or cervical bruits; thyroid not enlarged and symmetric  Respiratory: auscultation clear Cardiovascular: no murmurs, pulses are normal Musculoskeletal: no skeletal deformities or apparent scoliosis Skin: no rashes or neurocutaneous lesions  Neurologic Exam  Mental Status: alert; oriented to person, place and year; knowledge is normal for age; language is normal Cranial Nerves: visual fields are full to double simultaneous stimuli; extraocular movements are full and conjugate; pupils are round reactive to light; symmetric facial strength; midline tongue and uvula; air conduction is greater than bone conduction bilaterally Motor: Normal strength, tone and mass; good fine motor movements; no pronator drift Sensory: intact responses to cold, vibration, and  stereognosis Coordination: good finger-to-nose, rapid repetitive alternating movements Gait and Station: normal gait and station: patient is able to walk on heels, toes and tandem without difficulty; balance is adequate; Romberg exam is negative; Gower response is negative Reflexes: symmetric and diminished bilaterally; no clonus; bilateral flexor plantar responses  Assessment Generalized convulsive epilepsy - Stable. Without seizure activity for past 2 years. - Continue lamictal 25 mg daily. Will not obtain level since is low dose. - Continue dilantin 400 mg daily. Suggested taking this in split doses (200 mg in a.m. and 200 mg in p.m.) to avoid having to drink as much liquid at bedtime.  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 Reviewed importance of good sleep hygiene and completing assignments early/on time to avoid sleep deprivation.   Generalized nonconvulsive epilepsy - Stable. Denies trouble with concentration or productivity.   Follow-up in 6 months or sooner as needed.   Medication List   Accurate as of 11/14/16  9:26 AM.      acetaminophen 500 MG tablet Commonly known as:  TYLENOL Take 500 mg by mouth every 6 (six) hours as needed. Fever/pain   DILANTIN 100 MG ER capsule Generic drug:  phenytoin TAKE 4 CAPSULES BY MOUTH AT NIGHTTIME   ibuprofen 200 MG tablet Commonly known as:  ADVIL,MOTRIN Take 400 mg by mouth every 6 (six) hours as needed. Fever/pain   LAMICTAL 25 MG tablet Generic drug:  lamoTRIgine Take 1 tablet (25 mg total) by mouth 2 (two) times daily.     The medication list was reviewed and reconciled. All changes or newly prescribed medications were explained.  A complete medication list was provided to the patient/caregiver.  Olene Floss, MD [place], PGY-2   25 minutes of face-to-face time was spent with [facility].  I performed physical  examination, participated in history taking, and guided decision making.  Jodi Geralds MD

## 2016-11-14 NOTE — Progress Notes (Addendum)
[facility] Progress Note  Subjective:  Todd Young is a 26 y.o.   Allergies Allergen Reactions  . Amoxicillin Other (See Comments)    Mouth inflamed and itchy  . Divalproex Sodium Other (See Comments)    Pancreatitis   Objective: Blood pressure 100/74, pulse 72, height 6\' 2"  (1.88 m), weight 189 lb 6.4 oz (85.9 kg).   Assessment/Plan:

## 2016-11-14 NOTE — Assessment & Plan Note (Signed)
-   Stable. Denies trouble with concentration or productivity.

## 2016-11-30 ENCOUNTER — Other Ambulatory Visit: Payer: Self-pay | Admitting: Family

## 2016-11-30 MED FILL — DILANTIN 100 MG CAPSULE: 100 | 90 days supply | Qty: 360 | Fill #0

## 2016-12-12 DIAGNOSIS — Z111 Encounter for screening for respiratory tuberculosis: Secondary | ICD-10-CM | POA: Diagnosis not present

## 2016-12-12 DIAGNOSIS — Z0000 Encounter for general adult medical examination without abnormal findings: Secondary | ICD-10-CM | POA: Diagnosis not present

## 2016-12-24 DIAGNOSIS — Z23 Encounter for immunization: Secondary | ICD-10-CM | POA: Diagnosis not present

## 2016-12-24 DIAGNOSIS — Z111 Encounter for screening for respiratory tuberculosis: Secondary | ICD-10-CM | POA: Diagnosis not present

## 2016-12-24 MED FILL — LaMICtal 25 MG TABS: 25 | 90 days supply | Qty: 180 | Fill #3

## 2016-12-26 ENCOUNTER — Telehealth (INDEPENDENT_AMBULATORY_CARE_PROVIDER_SITE_OTHER): Payer: Self-pay | Admitting: Pediatrics

## 2016-12-26 DIAGNOSIS — G40309 Generalized idiopathic epilepsy and epileptic syndromes, not intractable, without status epilepticus: Secondary | ICD-10-CM

## 2016-12-26 MED ORDER — DIAZEPAM 20 MG RE GEL: RECTAL | 0 refills | Status: AC

## 2016-12-26 MED FILL — diazePAM 20 MG GEL: 20 | 1 days supply | Qty: 1 | Fill #0

## 2016-12-26 NOTE — Telephone Encounter (Signed)
Rx has been faxed to [facility]

## 2016-12-26 NOTE — Telephone Encounter (Signed)
The patient wanted diazepam gel as a rescue drug for seizures.  I've written a prescription for single package and we will fax it to [facility].

## 2016-12-26 NOTE — Telephone Encounter (Signed)
°  Who's calling (name and relationship to patient) : Todd Young(patient) Best contact number: [phone] Provider they see: Gaynell Face  Reason for call: Patient states that he is going out of country for 10 days and he wanted to know if he should take some Dilantin with him and if so he need a refill put in.  Please call if they think it is necessary.    PRESCRIPTION REFILL ONLY  Name of prescription:  Pharmacy:

## 2016-12-27 NOTE — Telephone Encounter (Signed)
Call from [facility]. Pharm to verify dose of diazepam rectal gel. RN verified order and advised per dosage for his wt. 20 mg is accurate but can verify with MD if needed. Pharm. Reports he reviewed order and it is 20 mg.

## 2017-02-04 DIAGNOSIS — Z0184 Encounter for antibody response examination: Secondary | ICD-10-CM | POA: Diagnosis not present

## 2017-02-25 MED FILL — DILANTIN 100 MG CAPSULE: 100 | 90 days supply | Qty: 360 | Fill #1

## 2017-03-25 DIAGNOSIS — Z23 Encounter for immunization: Secondary | ICD-10-CM | POA: Diagnosis not present

## 2017-03-28 ENCOUNTER — Other Ambulatory Visit: Payer: Self-pay | Admitting: Family

## 2017-03-28 DIAGNOSIS — G40309 Generalized idiopathic epilepsy and epileptic syndromes, not intractable, without status epilepticus: Secondary | ICD-10-CM

## 2017-03-28 MED FILL — LaMICtal 25 MG TABS: 25 | 90 days supply | Qty: 180 | Fill #0

## 2017-03-28 NOTE — Telephone Encounter (Signed)
°  Who's calling (name and relationship to patient) : Levada Dy (mom) Best contact number: [phone] Provider they see: Cloretta Ned  Reason for call: Mom calling for medication refill.  Brand name only; 90 day refill.  Need to pick up tomorrow at pharmacy.    PRESCRIPTION REFILL ONLY  Name of prescription: Lamictal 25 mg   Pharmacy: [place]

## 2017-05-22 MED FILL — DILANTIN 100 MG CAPSULE: 100 | 90 days supply | Qty: 360 | Fill #2

## 2017-06-26 ENCOUNTER — Other Ambulatory Visit: Payer: Self-pay | Admitting: Pediatrics

## 2017-06-26 DIAGNOSIS — G40309 Generalized idiopathic epilepsy and epileptic syndromes, not intractable, without status epilepticus: Secondary | ICD-10-CM

## 2017-06-26 MED FILL — LaMICtal 25 MG TABS: 25 | 90 days supply | Qty: 180 | Fill #0

## 2017-08-20 MED FILL — DILANTIN 100 MG CAPSULE: 100 | 90 days supply | Qty: 360 | Fill #3

## 2017-08-29 ENCOUNTER — Encounter (INDEPENDENT_AMBULATORY_CARE_PROVIDER_SITE_OTHER): Payer: Self-pay | Admitting: Pediatrics

## 2017-08-29 ENCOUNTER — Ambulatory Visit (INDEPENDENT_AMBULATORY_CARE_PROVIDER_SITE_OTHER): Payer: 59 | Admitting: Pediatrics

## 2017-08-29 VITALS — BP 120/70 | HR 84 | Ht 74.0 in | Wt 184.2 lb

## 2017-08-29 DIAGNOSIS — G40309 Generalized idiopathic epilepsy and epileptic syndromes, not intractable, without status epilepticus: Secondary | ICD-10-CM

## 2017-08-29 MED ORDER — LAMICTAL 25 MG PO TABS: 25.0000 mg | ORAL_TABLET | Freq: Two times a day (BID) | ORAL | 3 refills | Status: DC

## 2017-08-29 MED ORDER — DILANTIN 100 MG PO CAPS
ORAL_CAPSULE | ORAL | 3 refills | Status: DC
Start: 2017-08-29 — End: 2018-07-01

## 2017-08-29 NOTE — Patient Instructions (Addendum)
It was great to see you.  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  I am not certain what to make the exercise-induced nausea.  Congratulations on your impending marriage.  Sign up for My Chart so you can easily get to me.

## 2017-08-29 NOTE — Progress Notes (Signed)
Patient: Todd Young MRN: [id number] Sex: male DOB: 07/06/1990  Provider: Wyline Copas, MD Location of Care: [facility]  Note type: Routine return visit  History of Present Illness: Referral Source: Kelton Pillar, MD History from: patient and [facility] chart Chief Complaint: Generalized convulsive epilepsy  Todd Young is a 26 y.o. male who returns on August 29, 2017 for the first time since Nov 14, 2016.  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  He has been seizure-free.  He is on the unusual combination of Dilantin and low-dose levetiracetam.  This is because he had breakthrough seizures on Keppra and developed pancreatitis after years of being on Depakote.  He has not had any side effects from the medication.    One problem that he has noted is that when he is weightlifting, he cannot lift for more than about 10 minutes before he becomes nauseated.  Sometimes, this is delayed.  If he keeps pushing on, he will progress to vomiting.  He remembered one time when he pushed on, seemed to stabilize, left the gym to go out to get something to eat and became nauseated in the restaurant.  He had to step out into the cold air and after about 10 minutes things improved.  This only seems to be associated with anaerobic activity.  When he jogs or goes on [employer] or some other aerobic activity it does not happen.  In the past, he has had some problems with eye movements.  Those seem to have significantly subsided.  He thinks he has maybe had 1 or 2 in the 10 months since I last saw him, and there have been no unusual movements of his fingers or thumbs.  These 2 activities suggested there might be partial seizure activity, but never tried to treat them and they are going away.  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  This is becoming an area  of interest for him.  He is also getting married in [employer] to a woman he met at [employer] who is in Sports coach school.  [name]'s health is good.  He has lost about 5 pounds since I saw him.  He is getting adequate sleep at [place], often going to bed around 9 or 10 o'clock and sleeping until 7 or 8 the next morning.  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  He is very happy at school.  He worked very hard to get in and I am very happy for him.  Review of Systems: A complete review of systems was assessed and was negative.  Past Medical History Diagnosis Date  . Seizures ([place])    Hospitalizations: No., Head Injury: No., Nervous System Infections: No., Immunizations up to date: Yes.    Onset of seizures when he was 14. These were absence. [place] worked well and brought the episodes under control. Initially, he had problems with temper, Keppra controlled his seizures and it was continued until his doses were escalated to 3000 mg a day at which time Lamictal was started. I discontinued Keppra and continued Lamictal monotherapy. Unfortunately, he had issues with cognition and continued seizures.  Lamictal was tapered and Depakote started. He did extremely well on low dose Lamictal and average doses of Depakote.  In late December, 2014 he developed pancreatitis  on Depakote. I made a decision to place him on phenytoin to lessen the likelihood of generalized tonic-clonic seizures  He had an EEG in October 2009 that showed bifrontal sharp waves. He an MRI scan in January 2010 that was normal without with contrast.   Patient was seen in the ER Dept. at [facility] for high fever in Nov. 2012.  He started to have generalized tonic-clonic seizures and other episodes associated with unresponsive staring that suggested complex partial seizures.  He has experienced episodes of sensing the world oscillating back and  forth without any other alterations in awareness of the world or his ability to function. This typically happens when he has been looking in a computer for a long time or a movie.  These have been evaluated in detail, and I can find no evidence of seizure activity. Unfortunately, they are typically infrequent enough that we have not been able to study them with a prolonged video EEG that would be definitive. I am unaware of any seizure-like behavior of it that could cause this form of oscillopsia. I think it is more likely that this represents a migraine variant. Even with that, I have no personal experience with that symptom as a manifestation of migraine variant.  When the patient was in [place], [place] in the summer of 2013, he had an episode of paraesthesia in his right arm that began in the forearm and spread to his fingers. He was unable to shake his arm to reestablish normal sensation. Shortly after that began, he had twitching of his thumb and index finger that lasted intermittently over 10 minutes. This did not persist, although the numbness did persist for about an hour.   The patient had been sleep deprived as I expected would occur because of meals that began at 10 p.m. and nights that often lasted until 2 or 3 in the morning. I strongly encouraged him to get enough sleep, although again I suspected that the prolonged nature of his sensory symptoms made this more likely that he had a migraine variant than a seizure.  In early September 2013 and again April 16, 2012, the patient had irregular twitching of his middle finger. He was able to capture this on video, which I reviewed. I was absolutely certain that this did not represent a focal motor seizure and conveyed that both to his mother and to [place].  ER visit on 08/18/14 due to seizure activity.  His last EEG was in October 2009, and showed bifrontal sharp waves. MRI scan in January 2010, was normal without contrast.  Birth  History 8 lbs. infant born at [redacted] weeks gestational age to a 26 year old primigravida  Gestation was uncomplicated  Mother received normal spontaneous vaginal delivery  Nursery Course was uncomplicated  Growth and Development was recalled as normal  Behavior History none  Surgical History Procedure Laterality Date  . FLEXIBLE SIGMOIDOSCOPY  12/06/2011   Procedure: FLEXIBLE SIGMOIDOSCOPY;  Surgeon: Milus Banister, MD;  Location: [facility];  Service: Endoscopy;  Laterality: N/A;  . WISDOM TOOTH EXTRACTION  2011   x4    Family History family history is not on file. Family history is negative for migraines, seizures, intellectual disabilities, blindness, deafness, birth defects, chromosomal disorder, or autism.  Social History Socioeconomic History  . Marital status:  Engaged  . Years of education: None  . Highest education level: None  Social Needs  . Financial resource strain: None  . Food insecurity - worry: None  . Food insecurity -  inability: None  . Transportation needs - medical: None  . Transportation needs - non-medical: None  Occupational History  . None  Tobacco Use  . Smoking status: Never Smoker  . Smokeless tobacco: Never Used  Substance and Sexual Activity  . Alcohol use: Yes    Comment: Patient very rarely drinks alcohol.  . Drug use: No  . 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     He lives alone.  He is engaged to be married this summer.  His fiance is a Education administrator.    He enjoys exercising and reading.    Allergies Allergen Reactions  . Amoxicillin Other (See Comments)    Mouth inflamed and itchy  . Divalproex Sodium Other (See Comments)    Pancreatitis   Physical Exam BP 120/70   Pulse 84   Ht _0  (1.88 m)   Wt 184 lb 3.2 oz (83.6 kg)   BMI 23.65 kg/m   General: alert, well developed, well nourished, in no acute distress, brown hair, brown eyes, right  handed Head: normocephalic, no dysmorphic features Ears, Nose and Throat: Otoscopic: tympanic membranes normal; pharynx: oropharynx is pink without exudates or tonsillar hypertrophy Neck: supple, full range of motion, no cranial or cervical bruits Respiratory: auscultation clear Cardiovascular: no murmurs, pulses are normal Musculoskeletal: no skeletal deformities or apparent scoliosis Skin: no rashes or neurocutaneous lesions  Neurologic Exam  Mental Status: alert; oriented to person, place and year; knowledge is normal for age; language is normal Cranial Nerves: visual fields are full to double simultaneous stimuli; extraocular movements are full and conjugate; pupils are round reactive to light; funduscopic examination shows sharp disc margins with normal vessels; symmetric facial strength; midline tongue and uvula; air conduction is greater than bone conduction bilaterally Motor: Normal strength, tone and mass; good fine motor movements; no pronator drift Sensory: intact responses to cold, vibration, proprioception and stereognosis Coordination: good finger-to-nose, rapid repetitive alternating movements and finger apposition Gait and Station: normal gait and station: patient is able to walk on heels, toes and tandem without difficulty; balance is adequate; Romberg exam is negative; Gower response is negative Reflexes: symmetric and diminished bilaterally; no clonus; bilateral flexor plantar responses  Assessment 1. Generalized nonconvulsive epilepsy, G40.309. 2. Generalized convulsive epilepsy, G40.309.  Discussion I am pleased that [name]'s seizures are under control on Dilantin and Lamictal.  I have written the brand medically necessary because I do not want to change the delicate balance that we have achieved.  I do not have a good explanation for why he would have nausea with anaerobic activity.  It is possible that this could create some ischemia in his gut, particularly if one of  his vessels like the superior mesenteric was being pinched with the activity.  This could be some sort of migraine variant.  I do not think it has anything to do with his antiepileptic medicines or with seizures.  It would be interesting to run this by Dr. Andreas Blower, our Sports Medicine physician, to see if he is seeing anything like this.  It is not any physical activity that causes this problem.  Plan I refilled prescriptions for Dilantin and Lamictal.  [name] will return to see me in a year's time.  I wrote for 90-day refills with 3 refills, so he should be fine.  I will see him sooner if he has any breakthrough seizures or other side effects.  Medication List    Accurate as of 08/29/17 11:22 AM.      acetaminophen 500 MG tablet Commonly known as:  TYLENOL Take 500 mg by mouth every 6 (six) hours as needed. Fever/pain   diazepam 20 MG Gel Commonly known as:  DIASAT Take rectally after 2 minutes of persistent seizure.   DILANTIN 100 MG ER capsule Generic drug:  phenytoin TAKE 4 CAPSULES BY MOUTH AT NIGHTTIME   ibuprofen 200 MG tablet Commonly known as:  ADVIL,MOTRIN Take 400 mg by mouth every 6 (six) hours as needed. Fever/pain   LAMICTAL 25 MG tablet Generic drug:  lamoTRIgine TAKE 1 TABLET BY MOUTH TWICE A DAY    The medication list was reviewed and reconciled. All changes or newly prescribed medications were explained.  A complete medication list was provided to the patient/caregiver.  Jodi Geralds MD

## 2017-10-02 MED FILL — LaMICtal 25 MG TABS: 25 | 90 days supply | Qty: 180 | Fill #0

## 2017-11-19 MED FILL — DILANTIN 100 MG CAPSULE: 100 | 90 days supply | Qty: 360 | Fill #4

## 2017-12-30 MED FILL — LaMICtal 25 MG TABS: 25 | 90 days supply | Qty: 180 | Fill #1

## 2018-02-14 MED FILL — DILANTIN 100 MG CAPSULE: 100 | 90 days supply | Qty: 360 | Fill #0

## 2018-02-24 DIAGNOSIS — Z111 Encounter for screening for respiratory tuberculosis: Secondary | ICD-10-CM | POA: Diagnosis not present

## 2018-03-27 ENCOUNTER — Telehealth (INDEPENDENT_AMBULATORY_CARE_PROVIDER_SITE_OTHER): Payer: Self-pay | Admitting: Pediatrics

## 2018-03-27 NOTE — Telephone Encounter (Signed)
°  Who's calling (name and relationship to patient) : Todd Young (patient) Best contact number:  [phone] Provider they see: Gaynell Face Reason for call: Patient called requesting a letter for school to update records of his condition and a commendation letter   Please call     PRESCRIPTION REFILL ONLY  Name of prescription:  Pharmacy:

## 2018-03-28 NOTE — Telephone Encounter (Signed)
L/M informing patient that Dr. Gaynell Face is out of the office until Tuesday but will handle the letter upon his return

## 2018-03-28 NOTE — Telephone Encounter (Signed)
Pt called back to give the details of what should be noted in the letter: a brief update on current condition, symptoms, and medication and how this relates to him receiving extended time on his Step exam.

## 2018-04-01 ENCOUNTER — Encounter (INDEPENDENT_AMBULATORY_CARE_PROVIDER_SITE_OTHER): Payer: Self-pay

## 2018-04-04 MED FILL — LaMICtal 25 MG TABS: 25 | 90 days supply | Qty: 180 | Fill #2

## 2018-04-05 ENCOUNTER — Encounter (INDEPENDENT_AMBULATORY_CARE_PROVIDER_SITE_OTHER): Payer: Self-pay | Admitting: Pediatrics

## 2018-05-07 DIAGNOSIS — H43812 Vitreous degeneration, left eye: Secondary | ICD-10-CM | POA: Diagnosis not present

## 2018-05-14 MED FILL — DILANTIN 100 MG CAPSULE: 100 | 90 days supply | Qty: 360 | Fill #1

## 2018-06-30 ENCOUNTER — Encounter (INDEPENDENT_AMBULATORY_CARE_PROVIDER_SITE_OTHER): Payer: Self-pay

## 2018-06-30 ENCOUNTER — Telehealth (INDEPENDENT_AMBULATORY_CARE_PROVIDER_SITE_OTHER): Payer: Self-pay | Admitting: Pediatrics

## 2018-06-30 DIAGNOSIS — G40309 Generalized idiopathic epilepsy and epileptic syndromes, not intractable, without status epilepticus: Secondary | ICD-10-CM

## 2018-06-30 MED ORDER — LAMICTAL 25 MG PO TABS: 25.0000 mg | ORAL_TABLET | Freq: Two times a day (BID) | ORAL | 3 refills | Status: DC

## 2018-06-30 NOTE — Telephone Encounter (Signed)
°  Who's calling (name and relationship to patient) : Todd Young (patient) Best contact number:  [phone] Provider they see: Gaynell Face  Reason for call: Patient called stated that patient insurance has changed, and Dr Gaynell Face is out of network.  His insurance only covers generic medication. He wants to stay the brand name medication.  How would he go about getting his medication.  He would like for Dr Gaynell Face to call, he has only 5 days of medication left.  Need a PA for new medications to keep using the brand name.      PRESCRIPTION REFILL ONLY  Name of prescription: Lamital 25 brand name; Dilantin 100mg    Pharmacy:

## 2018-07-01 ENCOUNTER — Telehealth (INDEPENDENT_AMBULATORY_CARE_PROVIDER_SITE_OTHER): Payer: Self-pay | Admitting: Pediatrics

## 2018-07-01 MED ORDER — DILANTIN 100 MG PO CAPS: ORAL_CAPSULE | ORAL | 3 refills | Status: DC

## 2018-07-01 MED ORDER — LAMICTAL 25 MG PO TABS: 25.0000 mg | ORAL_TABLET | Freq: Two times a day (BID) | ORAL | 3 refills | Status: DC

## 2018-07-01 NOTE — Telephone Encounter (Signed)
I submitted the prior auth yesterday and it is awaiting a decision

## 2018-07-01 NOTE — Telephone Encounter (Signed)
°  Who's calling (name and relationship to patient): Todd Young, Patient  Best contact number: [phone]  Provider they see: Dr. Gaynell Face  Reason for call: Patient called saying that [employer] is asking for a formulary exception form instead of a prior authorization, need this ASAP because he is running out of medication. The formulary exception form is for the brand name of the Lamictal 25 mg tablet. Patient requests that we call him back to further discuss his request regarding this formulary exception form.      PRESCRIPTION REFILL ONLY  Name of prescription:  Pharmacy:

## 2018-07-01 NOTE — Telephone Encounter (Signed)
Prescription is been sent to the [facility] in [place] for Dilantin and Lamictal.  We will seek a formulary exemption tomorrow.Marland Kitchen

## 2018-07-01 NOTE — Telephone Encounter (Signed)
Pt called back to follow up on status of prior auth for Lamictal. Patient would also like to know if the prior auth is needed before medication can be filled.

## 2018-07-01 NOTE — Telephone Encounter (Signed)
Spoke with patient about his phone message. He stated that [place] stated to him that he was misinformed about needing a prior authorization form. [employer] informed him that he needs a formulary exception form instead. I informed him that the prior auth was done yesterday and we received the denial this morning. The denial is due to brand name not being accepted under his insurance. There needs to be an appeal or a peer to peer. Please advise

## 2018-07-01 NOTE — Telephone Encounter (Signed)
I spoke with [name] and will send the prescription to him at his pharmacy.  We need to get a formulary exemption.  I am not certain how to do this and will talk with Otila Kluver about it.

## 2018-07-02 NOTE — Telephone Encounter (Signed)
Thank you for your help.  I reviewed your letter and agree with the opinions expressed.

## 2018-07-02 NOTE — Telephone Encounter (Signed)
I wrote a letter of appeal to [facility] and will fax it today. TG

## 2018-07-16 NOTE — Telephone Encounter (Signed)
°  Who's calling (name and relationship to patient) : Abundio [employer]u [employer] [place]   Best contact number: [phone] Option 3 then Option 1   Provider they see: Rockwell Germany    Reason for call: John with [employer] returning Tina's follow up call regarding this appeal that was submitted. Please advise

## 2018-07-17 NOTE — Telephone Encounter (Signed)
I called and was told that the Lamictal was approved from 06/30/2018 through 06/28/2021.. A letter was sent to the patient. TG

## 2018-12-02 ENCOUNTER — Telehealth (INDEPENDENT_AMBULATORY_CARE_PROVIDER_SITE_OTHER): Payer: Self-pay | Admitting: Pediatrics

## 2018-12-02 NOTE — Telephone Encounter (Signed)
°  Who's calling (name and relationship to patient) : Todd Young (Self)  Best contact number: [phone] Provider they see: Dr. Gaynell Face Reason for call: Pt stated that he is currently taking Lamictal and Dilantin and has been experiencing dry mouth that has worsened over the course of the last 6 months accompanied by difficulty swallowing. Pt would like to know how to proceed/ what can be done to alleviate symptoms. Please advise.      PRESCRIPTION REFILL ONLY  Name of prescription: Lamictal and Dilantin  Pharmacy: [facility], [place] [id number]

## 2018-12-02 NOTE — Telephone Encounter (Signed)
I am not certain what to do about swallowing saliva.  The patient does not have dysphagia with liquid or food I recommended that he brushes teeth 3 times daily to keep the chances of dental decay down this is already been a bit of a problem.  I do not have any other patient who has experienced dry mouth from Lamictal.  I do not think we should change his medication.  If he had a breakthrough seizure it would be a very bad problem.

## 2019-06-10 ENCOUNTER — Other Ambulatory Visit (INDEPENDENT_AMBULATORY_CARE_PROVIDER_SITE_OTHER): Payer: Self-pay | Admitting: Pediatrics

## 2019-06-10 DIAGNOSIS — G40309 Generalized idiopathic epilepsy and epileptic syndromes, not intractable, without status epilepticus: Secondary | ICD-10-CM

## 2019-06-10 NOTE — Telephone Encounter (Signed)
Please send to the pharmacy °

## 2019-07-06 ENCOUNTER — Other Ambulatory Visit (INDEPENDENT_AMBULATORY_CARE_PROVIDER_SITE_OTHER): Payer: Self-pay | Admitting: Pediatrics

## 2019-07-06 DIAGNOSIS — G40309 Generalized idiopathic epilepsy and epileptic syndromes, not intractable, without status epilepticus: Secondary | ICD-10-CM

## 2019-07-06 MED ORDER — LAMICTAL 25 MG PO TABS: 25.0000 mg | ORAL_TABLET | Freq: Two times a day (BID) | ORAL | 0 refills | Status: DC

## 2019-07-06 NOTE — Telephone Encounter (Signed)
Please send rx to the [facility] in [place].

## 2019-07-06 NOTE — Telephone Encounter (Signed)
L/M informing patient that [facility] [place]. Invited him to call back if he had any questions or concerns

## 2019-07-06 NOTE — Telephone Encounter (Signed)
Who's calling (name and relationship to patient) : Tavarous Maese (mom)  Best contact number: [phone]  Provider they see: Dr. Gaynell Face  Reason for call:  Kedwin called in needing a refill on his Lamictal. 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 Next available afternoon appt is 07/29/19, Probation officer scheduled PT for that date at 2:45pm. Philipe only has 3 days left of his Lamictal, requesting Dr. Gaynell Face can send in a rx to get him to that appointment date and have his visit. Please advise    Call ID:      PRESCRIPTION REFILL ONLY  Name of prescription: Lamictal   Pharmacy: [facility], [address], [place] [phone]

## 2019-07-06 NOTE — Telephone Encounter (Signed)
[facility] [address] in [place] please call him to let him know that we have sent the prescription.

## 2019-07-29 ENCOUNTER — Encounter (INDEPENDENT_AMBULATORY_CARE_PROVIDER_SITE_OTHER): Payer: Self-pay | Admitting: Pediatrics

## 2019-07-29 ENCOUNTER — Telehealth (INDEPENDENT_AMBULATORY_CARE_PROVIDER_SITE_OTHER): Payer: BC Managed Care – PPO | Admitting: Pediatrics

## 2019-07-29 VITALS — Ht 74.0 in | Wt 176.0 lb

## 2019-07-29 DIAGNOSIS — G40A09 Absence epileptic syndrome, not intractable, without status epilepticus: Secondary | ICD-10-CM

## 2019-07-29 DIAGNOSIS — G40309 Generalized idiopathic epilepsy and epileptic syndromes, not intractable, without status epilepticus: Secondary | ICD-10-CM

## 2019-07-29 MED ORDER — DILANTIN 100 MG PO CAPS: ORAL_CAPSULE | ORAL | 3 refills | Status: DC

## 2019-07-29 MED ORDER — LAMICTAL 25 MG PO TABS: 25.0000 mg | ORAL_TABLET | Freq: Two times a day (BID) | ORAL | 3 refills | Status: DC

## 2019-07-29 NOTE — Patient Instructions (Signed)
Was a pleasure to see you today.  I am glad that you are doing well.  I refilled your prescriptions for a year.  Please make sure that when you pick them up that they are brand drug.  That is what I requested.  If there is anything you need in the upcoming year, please let me know.  I would like to see you around Christmas break 2021.

## 2019-07-29 NOTE — Progress Notes (Signed)
This is a Pediatric Specialist E-Visit follow up consult provided via [place] consented to an E-Visit consult today.  Location of patient: Todd Young is at home Location of provider: Sherron Flemings is in office Patient was referred by Todd Orn, MD   The following participants were involved in this E-Visit: patient, CMA, provider  Chief Complain/ Reason for E-Visit today: Epilepsy Total time on call: 15 minutes Follow up: 1 year    Patient: Todd Young MRN: [id number] Sex: male DOB: Jun 28, 1990  Provider: Wyline Copas, MD Location of Care: [facility]  Note type: Routine return visit  History of Present Illness: Referral Source: Todd Orn, MD History from: patient and Todd Young chart Chief Complaint: Epilepsy  Todd Young is a 29 y.o. male who was remotely evaluated on July 29, 2019 for the first time since August 29, 2017.  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  This is persisted into adulthood which makes it atypical.  He currently takes Dilantin and low-dose Lamictal.  When he was younger he was unable to tolerate higher dose Lamictal which led to the use of Depakote.  He developed pancreatitis after years on Depakote and it had to be discontinued.  Keppra caused subtle changes in his mood and behavior and had to be discontinued.  This is documented in greater detail in past medical history.  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  We set up a remote evaluation in order to touch base for me to briefly assess him and to refill his antiepileptic medicines.  Interestingly he is lost 12 pounds this winter with out trying to diet.  He thinks that during his medicine and surgery rotations, that he just had diminished appetite.  He weighs about 175 pounds on a 6 foot 2 frame which means that he is  thin.  He has been fully vaccinated for coronavirus.  He is finishing his last clinical rotation and will study for part 2 of his medical boards.  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  His general health is good.  He did not complain of any episodes of the world oscillating back-and-forth.  He is getting adequate sleep, going to bed between 9 and 10:00 and sleeping until 7:53 AM.  Review of Systems: A complete review of systems was remarkable for patient is here to be seen for epilepsy. patient reports that he has not had any seizures since his last visit. HE states no changes in medication. He has not concerns for this visit., all other systems reviewed and negative.  Past Medical History Diagnosis Date  . Seizures ([place])    Hospitalizations: No., Head Injury: No., Nervous System Infections: No., Immunizations up to date: Yes.    Copied from prior chart Onset of seizures when he was 14. These were absence. [place] worked well and brought the episodes under control. Initially, he had problems with temper, Keppra controlled his seizures and it was continued until his doses were escalated to 3000 mg a day at which time Lamictal was started. I discontinued Keppra and continued Lamictal monotherapy. Unfortunately, he had issues with cognition and continued seizures.  Lamictal was tapered and Depakote started. He did extremely well on low dose Lamictal and average doses of Depakote.  In late December, 2014 he developed pancreatitis on Depakote. I made a decision to place  him on phenytoin to lessen the likelihood of generalized tonic-clonic seizures  He had an EEG in October 2009 that showed bifrontal sharp waves. He an MRI scan in January 2010 that was normal without with contrast.   Patient was seen in the ER Dept. at [facility] for high fever in Nov. 2012.  He started to have generalized tonic-clonic seizures and other episodes associated with unresponsive staring that  suggested complex partial seizures.  He has experienced episodes of sensing the world oscillating back and forth without any other alterations in awareness of the world or his ability to function. This typically happens when he has been looking in a computer for a long time or a movie.  These have been evaluated in detail, and I can find no evidence of seizure activity. Unfortunately, they are typically infrequent enough that we have not been able to study them with a prolonged video EEG that would be definitive. I am unaware of any seizure-like behavior of it that could cause this form of oscillopsia. I think it is more likely that this represents a migraine variant. Even with that, I have no personal experience with that symptom as a manifestation of migraine variant.  When the patient was in [place] in the summer of 2013, he had an episode of paraesthesia in his right arm that began in the forearm and spread to his fingers. He was unable to shake his arm to reestablish normal sensation. Shortly after that began, he had twitching of his thumb and index finger that lasted intermittently over 10 minutes. This did not persist, although the numbness did persist for about an hour.   The patient had been sleep deprived as I expected would occur because of meals that began at 10 p.m. and nights that often lasted until 2 or 3 in the morning. I strongly encouraged him to get enough sleep, although again I suspected that the prolonged nature of his sensory symptoms made this more likely that he had a migraine variant than a seizure.  In early September 2013 and again April 16, 2012, the patient had irregular twitching of his middle finger. He was able to capture this on video, which I reviewed. I was absolutely certain that this did not represent a focal motor seizure and conveyed that both to his mother and to [place].  ER visit on 08/18/14 due to seizure activity.  His last EEG was in October  2009, and showed bifrontal sharp waves. MRI scan in January 2010, was normal without contrast.  Birth History 8 lbs. infant born at [redacted] weeks gestational age to a 28 year old primigravida  Gestation was uncomplicated  Mother received normal spontaneous vaginal delivery  Nursery Course was uncomplicated  Growth and Development was recalled as normal  Behavior History none  Surgical History Procedure Laterality Date  . FLEXIBLE SIGMOIDOSCOPY  12/06/2011   Procedure: FLEXIBLE SIGMOIDOSCOPY;  Surgeon: Milus Banister, MD;  Location: [facility];  Service: Endoscopy;  Laterality: N/A;  . WISDOM TOOTH EXTRACTION  2011   x4    Family History family history is not on file. Family history is negative for migraines, seizures, intellectual disabilities, blindness, deafness, birth defects, chromosomal disorder, or autism.  Social History Socioeconomic History  . Marital status: Single  . Years of education: 31  . Highest education level:  Finish second year of medical school  Occupational History  . Not employed  Tobacco Use  . Smoking status: Never Smoker  . Smokeless tobacco: Never Used  Substance  and Sexual Activity  . Alcohol use: Yes    Comment: Patient very rarely drinks alcohol.  . Drug use: No  . 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    He graduated from [employer].     He lives alone.     He enjoys exercising and reading.    Allergies Allergen Reactions  . Amoxicillin Other (See Comments)    Mouth inflamed and itchy  . Divalproex Sodium Other (See Comments)    Pancreatitis   Physical Exam Ht 6\' 2"  (1.88 m)   Wt 176 lb (79.8 kg)   BMI 22.60 kg/m   General: alert, well developed, well nourished, in no acute distress, brown hair, brown eyes, right handed Head: normocephalic, no dysmorphic features Neck: supple, full range of motion Musculoskeletal: no skeletal deformities or apparent scoliosis Skin: no rashes or  neurocutaneous lesions  Neurologic Exam  Mental Status: alert; oriented to person, place and year; knowledge is normal for age; language is normal Cranial Nerves: visual fields are full to double simultaneous stimuli; extraocular movements are full and conjugate; symmetric facial strength; midline tongue; normal hearing bilaterally Motor: normal functional strength, tone and mass; good fine motor movements; no pronator drift Coordination: good finger-to-nose, rapid repetitive alternating movements and finger apposition Gait and Station: normal gait and station: patient is able to walk on heels, toes and tandem without difficulty; balance is adequate; Romberg exam is negative; Gower response is negative  Assessment 1.  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  Discussion I am pleased that he is doing well, that his seizures are in control, that he has a stable lifestyle.  I am little worried about the weight loss, but this is fluctuated over time.  Plan I will see him in 10 months, around Christmastime 2021 when he is home for vacation.  My intent is to continue to follow him clinically until I have retired or he is no longer living in this area.  I refilled his prescriptions for Dilantin and Lamictal brand medically necessary and told him to pay particular attention to the refills.  Greater than 50% of a 15-minute visit was spent counseling and coordination of care concerning his seizures and their management.   Medication List   Accurate as of July 29, 2019  2:41 PM. If you have any questions, ask your nurse or doctor.    acetaminophen 500 MG tablet Commonly known as: TYLENOL Take 500 mg by mouth every 6 (six) hours as needed. Fever/pain   diazepam 20 MG Gel Commonly known as: DIASAT Take rectally after 2 minutes of persistent seizure.   Dilantin 100 MG ER capsule Generic drug: phenytoin TAKE 4 CAPSULE BY MOUTH EVERY NIGHT AT BEDTIME   ibuprofen 200 MG tablet Commonly known as:  ADVIL Take 400 mg by mouth every 6 (six) hours as needed. Fever/pain   LaMICtal 25 MG tablet Generic drug: lamoTRIgine Take 1 tablet (25 mg total) by mouth 2 (two) times daily.    The medication list was reviewed and reconciled. All changes or newly prescribed medications were explained.  A complete medication list was provided to the patient/caregiver.  Jodi Geralds MD

## 2019-07-30 DIAGNOSIS — G40A09 Absence epileptic syndrome, not intractable, without status epilepticus: Secondary | ICD-10-CM | POA: Insufficient documentation

## 2019-10-27 ENCOUNTER — Telehealth (INDEPENDENT_AMBULATORY_CARE_PROVIDER_SITE_OTHER): Payer: Self-pay | Admitting: Pediatrics

## 2019-10-27 NOTE — Telephone Encounter (Signed)
Forms have been placed on Dr. Hickling's desk 

## 2019-10-27 NOTE — Telephone Encounter (Signed)
Form was completed the day was received.  I believe that it has been faxed to [facility].

## 2019-10-27 NOTE — Telephone Encounter (Signed)
Patients mother dropped off DMV to be filled out for patient. Placed in Dr,. Hickling's box at the front

## 2020-03-01 ENCOUNTER — Telehealth (INDEPENDENT_AMBULATORY_CARE_PROVIDER_SITE_OTHER): Payer: Self-pay | Admitting: Pediatrics

## 2020-03-01 NOTE — Telephone Encounter (Signed)
Levada Dy, mother, dropped off DMV papers to be completed for patient. I have placed these papers in Dr. Melanee Left box @ the front desk. Please call patient at [phone] once completed to let him know and e-mail them to him @ [email or web address]. Cameron Sprang

## 2020-03-02 NOTE — Telephone Encounter (Signed)
I completed and signed forms this morning.  They are already 2 days late.  I called the patient and told him that we will fax the completed forms to [facility], mail the forms to [facility] and scanned the form into the chart.

## 2020-05-18 ENCOUNTER — Encounter (INDEPENDENT_AMBULATORY_CARE_PROVIDER_SITE_OTHER): Payer: Self-pay | Admitting: Pediatrics

## 2020-05-18 ENCOUNTER — Telehealth (INDEPENDENT_AMBULATORY_CARE_PROVIDER_SITE_OTHER): Payer: BC Managed Care – PPO | Admitting: Pediatrics

## 2020-05-18 DIAGNOSIS — G40A09 Absence epileptic syndrome, not intractable, without status epilepticus: Secondary | ICD-10-CM

## 2020-05-18 DIAGNOSIS — G40309 Generalized idiopathic epilepsy and epileptic syndromes, not intractable, without status epilepticus: Secondary | ICD-10-CM | POA: Diagnosis not present

## 2020-05-18 MED ORDER — LAMICTAL 25 MG PO TABS: 25.0000 mg | ORAL_TABLET | Freq: Two times a day (BID) | ORAL | 3 refills | Status: DC

## 2020-05-18 NOTE — Patient Instructions (Addendum)
It was a pleasure to see you today.  I am glad that you are seizure-free and not having any other significant issues.  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  I had like to see you again sometime in the summer 2022.  I will retire from the practice of medicine March 17, 2021 I want to make certain that we have sent your records.  I strongly recommend that you visit the neurology department in your new surroundings and in a sense "choose your physician ".  I will send all relevant records that I have to that physician.  Please let me know if there are any breakthrough seizures.  I do not expect it because things have been so stable.  For reasons of efficacy of this combination of medications I do not think we should make changes.  I am glad that you do not have any significant side effects from Dilantin.

## 2020-05-18 NOTE — Progress Notes (Signed)
Patient: Todd Young MRN: [id number] Sex: male DOB: 1990/10/22  Provider: Wyline Copas, MD Location of Care: [facility]  Note type: Routine return visit  History of Present Illness: Referral Source: Lavone Orn, MD History from: patient and [facility] chart Chief Complaint: Epilepsy  Todd Young is a 29 y.o. male who was virtual evaluated May 18, 2020 for the first time since July 29, 2019.  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  He was treated with Lamictal initially which she could not tolerate.  This was discontinued in Depakote controlled his symptoms.  As an adult he developed pancreatitis after many years on the medication leading to its discontinuation.  Keppra caused some changes in his mood and behavior causing it to be discontinued.  When Depakote was discontinued Dilantin was started.  This fortunately has controlled all of his symptoms in conjunction with low-dose Lamictal.  He has been seizure-free.  He has occasional episodes of movements of his eyes where it seems that the world is oscillating back-and-forth.  This is not associated with any other symptom and occurs rarely.  He also has some adventitious movements of his hands that are not tremorous that are also infrequent.  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  He is married.  His wife is a Chief Executive Officer who passed the bar in [place].  She works 3 days in [place] and the rest of the days in [place].  His general health is good.  He is received to [facility] vaccines for Covid.  To my surprise he is not engaged in any academic activities during his 71-month.  When he searches for postgraduate training.  He told me that he needs refill of Lamictal but not Dilantin.  He is sleeping 10 to 12  hours a day which is good because he seems to need more sleep than most adults.  There were no other concerns today.  Review of Systems: A complete review of systems was remarkable for patient is here to be seen for epilepsy. Patient reports that he has been doing well. He states that he has not had any seizures since his last visit. He has no concerns at this time, all other systems reviewed and negative.  Past Medical History Diagnosis Date  . Seizures ([place])    Hospitalizations: No., Head Injury: No., Nervous System Infections: No., Immunizations up to date: Yes.    Copied from prior chart notes Onset of seizures when he was 14. These were absence. [place] worked well and brought the episodes under control. Initially, he had problems with temper, Keppra controlled his seizures and it was continued until his doses were escalated to 3000 mg a day at which time Lamictal was started. I discontinued Keppra and continued Lamictal monotherapy. Unfortunately, he had issues with cognition and continued seizures.  Lamictal was tapered and Depakote started. He did extremely well on low dose Lamictal and average doses of Depakote.  In late December, 2014 he developed pancreatitis on Depakote. I made a decision to place him on phenytoin to lessen the likelihood of generalized tonic-clonic seizures  He had an EEG in October 2009 that showed bifrontal sharp waves. He an MRI scan in January 2010 that was normal without with contrast.   Patient was seen in the ER Dept. at [facility] for  high fever in Nov. 2012.  He started to have generalized tonic-clonic seizures and other episodes associated with unresponsive staring that suggested complex partial seizures.  He has experienced episodes of sensing the world oscillating back and forth without any other alterations in awareness of the world or his ability to function. This typically happens when he has been looking in a computer for a long time or  a movie.  These have been evaluated in detail, and I can find no evidence of seizure activity. Unfortunately, they are typically infrequent enough that we have not been able to study them with a prolonged video EEG that would be definitive. I am unaware of any seizure-like behavior of it that could cause this form of oscillopsia. I think it is more likely that this represents a migraine variant. Even with that, I have no personal experience with that symptom as a manifestation of migraine variant.  When the patient was in [place] in the summer of 2013, he had an episode of paraesthesia in his right arm that began in the forearm and spread to his fingers. He was unable to shake his arm to reestablish normal sensation. Shortly after that began, he had twitching of his thumb and index finger that lasted intermittently over 10 minutes. This did not persist, although the numbness did persist for about an hour.   The patient had been sleep deprived as I expected would occur because of meals that began at 10 p.m. and nights that often lasted until 2 or 3 in the morning. I strongly encouraged him to get enough sleep, although again I suspected that the prolonged nature of his sensory symptoms made this more likely that he had a migraine variant than a seizure.  In early September 2013 and again April 16, 2012, the patient had irregular twitching of his middle finger. He was able to capture this on video, which I reviewed. I was absolutely certain that this did not represent a focal motor seizure and conveyed that both to his mother and to [place].  ER visit on 08/18/14 due to seizure activity.  His last EEG was in October 2009, and showed bifrontal sharp waves. MRI scan in January 2010, was normal without contrast.  Birth History 8 lbs. infant born at [redacted] weeks gestational age to a 28 year old primigravida  Gestation was uncomplicated  Mother received normal spontaneous vaginal delivery   Nursery Course was uncomplicated  Growth and Development was recalled as normal  Behavior History none  Surgical History Procedure Laterality Date  . FLEXIBLE SIGMOIDOSCOPY  12/06/2011   Procedure: FLEXIBLE SIGMOIDOSCOPY;  Surgeon: Milus Banister, MD;  Location: [facility];  Service: Endoscopy;  Laterality: N/A;  . WISDOM TOOTH EXTRACTION  2011   x4    Family History family history is not on file. Family history is negative for migraines, seizures, intellectual disabilities, blindness, deafness, birth defects, chromosomal disorder, or autism.  Social History Socioeconomic History  . Marital status:  Married    Spouse name:  [place]  . Number of children:  None  . Years of education:  63  . Highest education level:  Ecologist  Occupational History  . Not currently employed  Tobacco Use  . Smoking status: Never Smoker  . Smokeless tobacco: Never Used  Substance and Sexual Activity  . Alcohol use: Yes    Comment: Patient very rarely drinks alcohol.  . Drug use: No  . 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    He graduated from [employer].     He lives alone.     He enjoys exercising and reading.    Allergies Allergen Reactions  . Amoxicillin Other (See Comments)    Mouth inflamed and itchy  . Divalproex Sodium Other (See Comments)    Pancreatitis   Physical Exam There were no vitals taken for this visit.  General: alert, well developed, well nourished, in no acute distress, brown hair, brown eyes, right handed Head: normocephalic, no dysmorphic features Neck: supple, full range of motion Musculoskeletal: no skeletal deformities or apparent scoliosis Skin: no rashes or neurocutaneous lesions  Neurologic Exam  Mental Status: alert; oriented to person, place and year; knowledge is normal for age; language is normal Cranial Nerves: visual fields are full to double simultaneous stimuli; extraocular  movements are full and conjugate; symmetric facial strength; midline tongue and uvula; air conduction is greater than bone conduction bilaterally Motor: normal functional strength, tone and mass; good fine motor movements; no pronator drift Coordination: good finger-to-nose, rapid repetitive alternating movements and finger apposition Gait and Station: normal gait and station: patient is able to walk on heels, toes and tandem without difficulty; balance is adequate; Romberg exam is negative; Gower response is negative  Assessment 1.  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  Discussion I am pleased that he is doing well, that his seizures are in control, that he has a stable lifestyle.  There is no reason to change his current treatment.  Plan A prescription was issued for Lamictal.  He will return to see me in 6 months' time we will need to find in adult epileptologist in the city where he seeks his internal medicine training.  She   Medication List   Accurate as of May 18, 2020  7:58 PM. If you have any questions, ask your nurse or doctor.    acetaminophen 500 MG tablet Commonly known as: TYLENOL Take 500 mg by mouth every 6 (six) hours as needed. Fever/pain   diazepam 20 MG Gel Commonly known as: DIASAT Take rectally after 2 minutes of persistent seizure.   Dilantin 100 MG ER capsule Generic drug: phenytoin TAKE 4 CAPSULE BY MOUTH EVERY NIGHT AT BEDTIME   ibuprofen 200 MG tablet Commonly known as: ADVIL Take 400 mg by mouth every 6 (six) hours as needed. Fever/pain   LaMICtal 25 MG tablet Generic drug: lamoTRIgine Take 1 tablet (25 mg total) by mouth 2 (two) times daily.    The medication list was reviewed and reconciled. All changes or newly prescribed medications were explained.  A complete medication list was provided to the patient/caregiver.  Jodi Geralds MD

## 2020-05-18 NOTE — Progress Notes (Signed)
  This is a Pediatric Specialist E-Visit follow up consult provided via [place] consented to an E-Visit consult today.  Location of patient: Todd Young is at home Location of provider: Sherron Flemings is at home. Patient was referred by Lavone Orn, MD   The following participants were involved in this E-Visit: patient, CMA, provider  Chief Complaint/ Reason for E-Visit today: Epilepsy Total time on call: 15 minutes Follow up: 6 months

## 2020-07-27 ENCOUNTER — Other Ambulatory Visit (INDEPENDENT_AMBULATORY_CARE_PROVIDER_SITE_OTHER): Payer: Self-pay | Admitting: Pediatrics

## 2020-07-27 DIAGNOSIS — G40309 Generalized idiopathic epilepsy and epileptic syndromes, not intractable, without status epilepticus: Secondary | ICD-10-CM

## 2020-08-09 ENCOUNTER — Other Ambulatory Visit (INDEPENDENT_AMBULATORY_CARE_PROVIDER_SITE_OTHER): Payer: Self-pay | Admitting: Pediatrics

## 2020-08-09 DIAGNOSIS — G40309 Generalized idiopathic epilepsy and epileptic syndromes, not intractable, without status epilepticus: Secondary | ICD-10-CM

## 2020-08-09 MED ORDER — DILANTIN 100 MG PO CAPS: ORAL_CAPSULE | ORAL | 3 refills | Status: DC

## 2020-08-09 NOTE — Telephone Encounter (Signed)
  Who's calling (name and relationship to patient) : Dominica Severin ( self)  Best contact number:[phone]  Provider they see: Dr. Gaynell Face   Reason for call:Pateint is out of refills for medication just calling to see if we can send that in for him      [place]  Name of prescription:Dilantin  Pharmacy: [facility]

## 2020-08-09 NOTE — Telephone Encounter (Signed)
Please send to the pharmacy °

## 2020-10-23 ENCOUNTER — Encounter (INDEPENDENT_AMBULATORY_CARE_PROVIDER_SITE_OTHER): Payer: Self-pay

## 2020-10-25 ENCOUNTER — Encounter (INDEPENDENT_AMBULATORY_CARE_PROVIDER_SITE_OTHER): Payer: Self-pay | Admitting: Pediatrics

## 2020-10-25 ENCOUNTER — Other Ambulatory Visit: Payer: Self-pay

## 2020-10-25 ENCOUNTER — Ambulatory Visit (INDEPENDENT_AMBULATORY_CARE_PROVIDER_SITE_OTHER): Payer: BC Managed Care – PPO | Admitting: Pediatrics

## 2020-10-25 VITALS — BP 128/80 | HR 72 | Ht 74.0 in | Wt 191.4 lb

## 2020-10-25 DIAGNOSIS — G40A09 Absence epileptic syndrome, not intractable, without status epilepticus: Secondary | ICD-10-CM

## 2020-10-25 DIAGNOSIS — R200 Anesthesia of skin: Secondary | ICD-10-CM | POA: Insufficient documentation

## 2020-10-25 DIAGNOSIS — R202 Paresthesia of skin: Secondary | ICD-10-CM

## 2020-10-25 NOTE — Patient Instructions (Signed)
I checked her prescriptions and both of been refilled for a year and you should be able to get your Dilantin.  Please let me know if that is not the case.  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  Thank you for your kind words.  I intend to stay in [place] although will be [place] trips [place].  If you need to get up with me it is always possible to do so.  Give my best to your parents.  When you get to [facility] find a pharmacy and find a provider and I will get a prescription and records to them.

## 2020-10-25 NOTE — Progress Notes (Signed)
Patient: Todd Young MRN: [id number] Sex: male DOB: 29-Jun-1990  Provider: Wyline Copas, MD Location of Care: [facility]  Note type: Routine return visit  History of Present Illness: Referral Source: Lavone Orn, MD History from: patient and [facility] chart Chief Complaint: Epilepsy  Todd Young is a 30 y.o. 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  There is a time when it seems that he might have a focal epilepsy.  He has been on a variety of medications including Keppra, Lamictal, Depakote, and Dilantin.  Keppra failed to control his seizures and doses as high as 3000 mg a day.  Lamictal was started and initially appeared to work well to control his seizures.  He had issues with cognition and occasional recurrent seizures.  Depakote worked extremely well with low doses of Lamictal.  He then developed pancreatitis secondary to Depakote.  In order to control his generalized tonic-clonic seizures, since lamotrigine in small doses seemed to control his absence seizure's, Dilantin was started and this has controlled his seizures completely without side effects.  Currently he has some numbness in his right fourth toe when he stepped on his foot.  It is more of a hypnesthesia than the paresthesia.  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  He has experienced occasional episodes of movements of his eyes were seen since there were as if the world is oscillating back-and-forth but this has ceased.  He has some adventitious movements of his hands but did not appear to be tremorous.  These also have ceased.  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  He will be in [place] for at least 3 years and possibly longer if he enters a cardiology  fellowship.  His general health is good.  He has not contracted COVID.  He is fully vaccinated and boosted.  Though he was quite sleepy when I talked to him in December, that seems to have subsided.  Review of Systems: A complete review of systems was remarkable for patient is here to be seen for epilepsy. He reports that he has been doing well. He states that hehas not had any seizures since his last visit. He states that he has experienced numbness in his fourth toe on his right foot. He states that is his only concern. No other concerns at this time., all other systems reviewed and negative.  Past Medical History Diagnosis Date  . Seizures ([place])    Hospitalizations: No., Head Injury: No., Nervous System Infections: No., Immunizations up to date: Yes.    Copied from prior chart notes Onset of seizures when he was 14. These were absence. [place] worked well and brought the episodes under control.  Initially, he had problems with temper, Keppra controlled his seizures.  It was continued until his doses were escalated to 3000 mg a day at which time Lamictal was started.  Keppra was discontinued and he continued Lamictal monotherapy. Unfortunately, he had issues with cognition and continued seizures.  Lamictal was tapered and Depakote started. He did extremely well on low dose Lamictal and average doses of Depakote.  In late December, 2014 he developed pancreatitis on Depakote. I made a decision to place him on phenytoin to lessen the likelihood of generalized tonic-clonic seizures  His last EEG  was in October 2009 that showed bifrontal sharp waves.   He an MRI scan in January 2010 that was normal without with contrast.   Patient was seen in the ER Dept. at [facility] for high fever in Nov. 2012.  He started to have generalized tonic-clonic seizures and other episodes associated with unresponsive staring that suggested complex partial seizures.  When the patient was in [place] in the summer of 2013, he had an episode of paraesthesia in his right arm that began in the forearm and spread to his fingers. He was unable to shake his arm to reestablish normal sensation. Shortly after that began, he had twitching of his thumb and index finger that lasted intermittently over 10 minutes. This did not persist, although the numbness did persist for about an hour.   He had been sleep deprived as I expected would occur because of meals that began at 10 p.m. and nights that often lasted until 2 or 3 in the morning. I strongly encouraged him to get enough sleep, although again I suspected that the prolonged nature of his sensory symptoms made this more likely that he had a migraine variant than a seizure.  In early September 2013 and again April 16, 2012, the patient had irregular twitching of his middle finger. He was able to capture this on video, which I reviewed. I was absolutely certain that this did not represent a focal motor seizure and conveyed that both to his mother and to [place].  ER visit on 08/18/14 due to seizure activity.  This was his last seizure.  He has experienced episodes of sensing the world oscillating back and forth without any other alterations in awareness of the world or his ability to function. This typically happens when he has been looking in a computer for a long time or a movie.   These have been evaluated in detail, and I can find no evidence of seizure activity. Unfortunately, they are typically infrequent enough that we have not been able to study them with a prolonged video EEG that would be definitive. I am unaware of any seizure-like behavior of it that could cause this form of oscillopsia. I think it is more likely that this represents a migraine variant. Even with that, I have no personal experience with that symptom as a manifestation of migraine variant.  Birth History 8 lbs. infant born at [redacted] weeks gestational age to a 29 year old  primigravida  Gestation was uncomplicated  Mother received normal spontaneous vaginal delivery  Nursery Course was uncomplicated  Growth and Development was recalled as normal  Behavior History none  Surgical History Procedure Laterality Date  . FLEXIBLE SIGMOIDOSCOPY  12/06/2011   Procedure: FLEXIBLE SIGMOIDOSCOPY;  Surgeon: Milus Banister, MD;  Location: [facility];  Service: Endoscopy;  Laterality: N/A;  . WISDOM TOOTH EXTRACTION  2011   x4    Family History family history is not on file. Family history is negative for migraines, seizures, intellectual disabilities, blindness, deafness, birth defects, chromosomal disorder, or autism.  Social History Socioeconomic History  . Marital status:  Married  . Years of education:  51  . Highest education level:  Medical school graduate  Occupational History  .  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 Smoking status: Never Smoker  . Smokeless tobacco: Never Used  Substance and Sexual Activity  . Alcohol use: Yes    Comment: Patient very rarely drinks alcohol.  . Drug use: No  . 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   He graduated from [employer].    He lives with his wife.Marland Kitchen    He enjoys exercising and reading.    Allergies Allergen Reactions  . Amoxicillin Other (See Comments)    Mouth inflamed and itchy  . Divalproex Sodium Other (See Comments)    Pancreatitis   Physical Exam BP 128/80   Pulse 72   Ht 6\' 2"  (1.88 m)   Wt 191 lb 6.4 oz (86.8 kg)   BMI 24.57 kg/m   General: alert, well developed, well nourished, in no acute distress, brown hair, brown eyes, right handed Head: normocephalic, no dysmorphic features Ears, Nose and Throat: Otoscopic: tympanic membranes normal; pharynx: oropharynx is pink without exudates or tonsillar hypertrophy Neck: supple, full range of motion, no cranial or cervical bruits Respiratory: auscultation  clear Cardiovascular: no murmurs, pulses are normal Musculoskeletal: no skeletal deformities or apparent scoliosis Skin: no rashes or neurocutaneous lesions  Neurologic Exam  Mental Status: alert; oriented to person, place and year; knowledge is normal for age; language is normal Cranial Nerves: visual fields are full to double simultaneous stimuli; extraocular movements are full and conjugate; pupils are round reactive to light; funduscopic examination shows sharp disc margins with normal vessels; symmetric facial strength; midline tongue and uvula; air conduction is greater than bone conduction bilaterally Motor: Normal strength, tone and mass; good fine motor movements; no pronator drift Sensory: intact responses to cold, vibration, proprioception and stereognosis Coordination: good finger-to-nose, rapid repetitive alternating movements and finger apposition Gait and Station: normal gait and station: patient is able to walk on heels, toes and tandem without difficulty; balance is adequate; Romberg exam is negative; Gower response is negative Reflexes: symmetric and diminished bilaterally; no clonus; bilateral flexor plantar responses  Assessment 1.  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 2.  Numbness right fourth toe, R20.0  Discussion Plea is medically neurologically stable.  There is no reason to make any change in his current treatment.  Medications and doses are not typical and certainly would not be what I would have chosen however they work, are unassociated with side effects.  I think it would be counterproductive to change to some medications that would be more "rational".  Plan I strongly urged him to continue to get adequate sleep to be compliant with his treatment regimen.  He needs to find a neurologist in [place] which should not be very hard at the [facility].  He also needs to find a pharmacy or we can send a 40-month supply of lamotrigine and  Dilantin.  Greater than 50% of a 30-minute visit was spent in counseling coordination of care concerning his current treatment and discussing transition of care.  I do not think there is anything to be done about the numbness in his fourth toe unless it progresses.  I do not think that it has anything to do with the medicines that he takes.   Medication List   Accurate as of Oct 25, 2020  8:55 AM. If you have any questions, ask your nurse or doctor.    acetaminophen 500 MG tablet Commonly known as: TYLENOL Take 500 mg by mouth every 6 (six) hours as needed. Fever/pain   diazepam 20 MG Gel Commonly known as: DIASAT Take rectally after 2 minutes of persistent seizure.   Dilantin 100 MG ER capsule Generic drug: phenytoin TAKE 4 CAPSULE BY MOUTH EVERY NIGHT AT BEDTIME   ibuprofen  200 MG tablet Commonly known as: ADVIL Take 400 mg by mouth every 6 (six) hours as needed. Fever/pain   LaMICtal 25 MG tablet Generic drug: lamoTRIgine TAKE 1 TABLET(25 MG) BY MOUTH TWICE DAILY    The medication list was reviewed and reconciled. All changes or newly prescribed medications were explained.  A complete medication list was provided to the patient/caregiver.  Jodi Geralds MD

## 2020-11-17 DIAGNOSIS — Z111 Encounter for screening for respiratory tuberculosis: Secondary | ICD-10-CM | POA: Diagnosis not present

## 2021-01-30 ENCOUNTER — Telehealth (INDEPENDENT_AMBULATORY_CARE_PROVIDER_SITE_OTHER): Payer: Self-pay | Admitting: Pediatrics

## 2021-01-30 NOTE — Telephone Encounter (Signed)
The PA has been initiated and is awaiting decision. TG

## 2021-01-30 NOTE — Telephone Encounter (Signed)
You may not have seen this yet, but could you please handle this?  Thank you.  See you on Wednesday.

## 2021-01-30 NOTE — Telephone Encounter (Signed)
[facility] approved the Dilantin. I left a message with the pharmacy to let them know. TG

## 2021-01-30 NOTE — Telephone Encounter (Signed)
  Who's calling (name and relationship to patient) : [facility] from [facility] [place] contact number: [phone]  Provider they see: Dr. Gaynell Face  Reason for call: Pharmacy states that patient's dilantin requires prior authorization.    PRESCRIPTION REFILL ONLY  Name of prescription: DILANTIN 100 MG ER capsule [facility] [place] [facility] [address]

## 2021-03-29 ENCOUNTER — Encounter (INDEPENDENT_AMBULATORY_CARE_PROVIDER_SITE_OTHER): Payer: Self-pay

## 2021-03-29 ENCOUNTER — Other Ambulatory Visit: Payer: Self-pay | Admitting: Family

## 2021-03-29 DIAGNOSIS — G40309 Generalized idiopathic epilepsy and epileptic syndromes, not intractable, without status epilepticus: Secondary | ICD-10-CM

## 2021-03-29 NOTE — Telephone Encounter (Signed)
Visit note with Dr. Gaynell Face 10/25/2020 indicate patient would be seeing a Neurologist in [place]. MyChart message sent to patient to see if care has been established.

## 2021-03-31 MED ORDER — LAMICTAL 25 MG PO TABS: ORAL_TABLET | ORAL | 1 refills | Status: DC

## 2021-03-31 NOTE — Telephone Encounter (Signed)
Called patient as the MyChart message was not yet responded to.   Patient informs that he has not been able to get an appointment scheduled with a neurologist, but he has an appointment scheduled to establish care with a PCP 05/26/2021 so a referral may be sent to speed up the process. Patient requests that the Dilantil refill and a Lamictal refill be sent. Informed patient this message would be sent to a provider for authorization. Patient states understanding and ended the call.

## 2021-03-31 NOTE — Telephone Encounter (Signed)
[facility] [place]. TG

## 2021-04-13 ENCOUNTER — Telehealth (INDEPENDENT_AMBULATORY_CARE_PROVIDER_SITE_OTHER): Payer: Self-pay | Admitting: Family

## 2021-04-13 DIAGNOSIS — G40309 Generalized idiopathic epilepsy and epileptic syndromes, not intractable, without status epilepticus: Secondary | ICD-10-CM

## 2021-04-13 MED ORDER — LAMOTRIGINE 25 MG PO TABS: 25.0000 mg | ORAL_TABLET | Freq: Two times a day (BID) | ORAL | 1 refills | Status: DC

## 2021-04-13 NOTE — Telephone Encounter (Signed)
See message from me below. I already have insurance information, did PA for Lamictal and it was denied. I contacted patient to discuss a plan. I called the patient and explained that insurance denied the Lamictal and that I can appeal but that it takes time and still may not be approved. He asked for the appeal to be initiated, which I will do. In the interim, he wants to have generic Lamotrigine to have on hand so he will not be out of medication, so I will send that to the pharmacy. I recommended getting blood levels before starting Lamotrigine and 1 week afterwards and he will investigate how to get that done in [place].   [name] also had questions about some numbness in his feet and wonders if it is related to Dilantin. I told him that peripheral neuropathy can be related to long term Dilantin use. He has an appointment with PCP in December and plans to get a referral to Neurology at that time.   I will contact [name] when I learn more about the Lamictal appeal. He agreed with this plan.

## 2021-04-13 NOTE — Telephone Encounter (Signed)
I received a message from [facility] in [place] that [name]'s insurance required PA for brand Lamictal. I did the PA and it was denied. I called and left a message for [name] to call me back to discuss. TG

## 2021-04-13 NOTE — Telephone Encounter (Signed)
Received PA request from pharmacy, spoke to patient to get updated insurance information to be able to process PA. Patient states that he will upload it to my chart when he gets a chance he does not have it with him at that moment.

## 2021-04-14 NOTE — Telephone Encounter (Signed)
The appeal letter was faxed to [place]. TG

## 2021-04-18 NOTE — Telephone Encounter (Signed)
I checked on the appeal for brand Lamictal with [facility]. The appeal is still pending on this date. TG

## 2021-04-19 ENCOUNTER — Telehealth (HOSPITAL_BASED_OUTPATIENT_CLINIC_OR_DEPARTMENT_OTHER)

## 2021-04-19 ENCOUNTER — Encounter

## 2021-04-19 NOTE — Telephone Encounter (Signed)
 Harriett Sine from [facility] physician access line called to schedule resident with a Neurologist to adjust current Epilepsy medication. Second opinion.    Please have someone reach out to resident to schedule an appt with dept if applicable.    Informed Rep Harriett Sine a referral from provider would be helpful she confirmed pt has upcoming appt with his PCP in Dec but the issue needs to be addressed sooner. Please contact patient at (p) [phone].

## 2021-04-20 MED ORDER — LAMICTAL 25 MG PO TABS: ORAL_TABLET | ORAL | 1 refills | Status: AC

## 2021-04-20 NOTE — Telephone Encounter (Signed)
I received a fax from [place] that brand Lamictal was approved. I left a message for [name] to let him know and sent in an updated Rx for him for brand medication. TG

## 2021-04-20 NOTE — Telephone Encounter (Signed)
Patient placed on cancellation list

## 2021-04-21 ENCOUNTER — Other Ambulatory Visit: Admit: 2021-04-21 | Payer: PRIVATE HEALTH INSURANCE | Primary: Internal Medicine

## 2021-04-21 ENCOUNTER — Encounter

## 2021-04-21 ENCOUNTER — Encounter (HOSPITAL_BASED_OUTPATIENT_CLINIC_OR_DEPARTMENT_OTHER): Admitting: Internal Medicine

## 2021-04-21 ENCOUNTER — Encounter (HOSPITAL_BASED_OUTPATIENT_CLINIC_OR_DEPARTMENT_OTHER)

## 2021-04-21 ENCOUNTER — Other Ambulatory Visit

## 2021-04-21 ENCOUNTER — Ambulatory Visit
Admit: 2021-04-21 | Discharge: 2021-04-21 | Payer: PRIVATE HEALTH INSURANCE | Attending: Internal Medicine | Primary: Internal Medicine

## 2021-04-21 VITALS — BP 111/74 | HR 73 | Temp 97.5°F | Ht 74.0 in | Wt 182.0 lb

## 2021-04-21 DIAGNOSIS — G40909 Epilepsy, unspecified, not intractable, without status epilepticus: Secondary | ICD-10-CM

## 2021-04-21 LAB — HEMOGLOBIN A1C: HEMOGLOBIN A1C % (INT/EXT): 4.9 % (ref <5.6)

## 2021-04-21 LAB — VITAMIN B12 WITH REFLEX TO MMA: Vitamin B-12: 280 pg/mL (ref 193–986)

## 2021-04-21 NOTE — Progress Notes (Signed)
 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 He takes dilantin and lamcital for epilepsy. Last seizure was 2016 after which he was started on dilantin. No seizures since then. He also takes lamictal. 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 For 6 months or so, he has been having peripheral neuropathy advancing to the balls of the feets bilaterally. Pins and needles lately with some 'dulled' sensations. He also has a burning pain, especially in the right big toe. Hands are ok.     ROS: no other symptoms including fever, chills, chest pain, dyspnea, abdominal pain, nausea, vomiting, constipation, diarrhea, lightheadedness, dizziness, numbness, or tingling anywhere.     He did note a weight loss of 10 lbs since starting residency which he feels is likely due to poor eating habits and no longer going to the gym.Marland Kitchen     PMHx:  Epilepsy    Medications:  Dilantin 400 mg QPM  Lamictal 25 mg BID    Allergies:  ?amoxicillin as a kid  No clear known allergies    FMHx:  Mom - Healthy  Dad - HTN  Paternal grandmother - CAD  Sister - Healthy    Care Team:  PCP: Dala Dock, MD    Social:  Housing: secure  Food: no issues  Tobacco: never  Safety: feels safe  Drugs: no drugs  EtOH: couple of glasses of wine per week    OBJECTIVE:  Vitals:    04/21/21 1400   BP: 111/74   Pulse: 73   Temp: 36.4 C (97.5 F)     Physical Exam:  General: Comfortable appearing, not distressed, conversant in [place]  Pulm: normal effort, no cough  Abd: non obese  Extr: no edema  Derm: no wounds or ulcerations, scale, erythema to the feet  Neuro: alert and oriented x3; grossly normal gait; diminished sensation to soft touch bilaterally on the plantar aspects of the feet starting near the arch and extending distally to the balls feet; strength intact and symmetric throughout  Psych:  Appropriate speech, affect, and behavior    ASSESSMENT/PLAN:  [name], 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     Cris was seen today for follow-up.    Diagnoses and all orders for this visit:    Nonintractable epilepsy without status epilepticus, unspecified epilepsy type (CMS/HCC)  -     INT  [facility] (T [facility]OLOGY); Future  -     Vitamin B12 with reflex to MMA; Future  -     Phenytoin level, free; Future  -     Lamotrigine level; Future  -     Hemoglobin A1c; Future    Neuropathy  -     Hemoglobin A1c; Future      I spent 35 minutes in the care of this patient, reviewing old records, interviewing and examining the patient, discussing the plan, and documenting this encounter.

## 2021-04-25 LAB — METHYLMALONIC ACID, SERUM: Methylmalonic Acid, S: 543 nmol/L — ABNORMAL HIGH (ref 87–318)

## 2021-04-26 ENCOUNTER — Ambulatory Visit
Payer: PRIVATE HEALTH INSURANCE | Attending: Student in an Organized Health Care Education/Training Program | Primary: Internal Medicine

## 2021-04-26 LAB — LAMOTRIGINE LEVEL: Lamotrigine Lvl: <0.5 ug/mL — ABNORMAL LOW (ref 4.0–18.0)

## 2021-05-03 ENCOUNTER — Encounter (HOSPITAL_BASED_OUTPATIENT_CLINIC_OR_DEPARTMENT_OTHER)

## 2021-05-15 ENCOUNTER — Other Ambulatory Visit

## 2021-05-16 ENCOUNTER — Institutional Professional Consult (permissible substitution)
Admit: 2021-05-16 | Discharge: 2021-05-16 | Payer: PRIVATE HEALTH INSURANCE | Attending: Neurology | Primary: Internal Medicine

## 2021-05-16 ENCOUNTER — Other Ambulatory Visit

## 2021-05-16 ENCOUNTER — Encounter

## 2021-05-16 ENCOUNTER — Encounter (HOSPITAL_BASED_OUTPATIENT_CLINIC_OR_DEPARTMENT_OTHER): Admitting: Neurology

## 2021-05-16 ENCOUNTER — Other Ambulatory Visit: Admit: 2021-05-16 | Payer: PRIVATE HEALTH INSURANCE | Primary: Internal Medicine

## 2021-05-16 ENCOUNTER — Encounter (HOSPITAL_BASED_OUTPATIENT_CLINIC_OR_DEPARTMENT_OTHER)

## 2021-05-16 VITALS — BP 138/75 | HR 80 | Ht 74.0 in | Wt 183.0 lb

## 2021-05-16 DIAGNOSIS — G629 Polyneuropathy, unspecified: Secondary | ICD-10-CM

## 2021-05-16 LAB — PHENYTOIN LEVEL, TOTAL: Phenytoin level, total: 16.1 ug/mL (ref 10.0–20.0)

## 2021-05-16 LAB — ALBUMIN: Albumin: 4.4 g/dL (ref 3.2–5.0)

## 2021-05-16 LAB — PROTEIN, TOTAL: Protein, total: 7.1 g/dL (ref 6.0–8.4)

## 2021-05-16 MED ORDER — diazePAM (Valtoco) 10 mg/spray (0.1 mL)
10 | NASAL | 1 refills | 30.00000 days | Status: AC | PRN
Start: 2021-05-16 — End: ?
  Filled 2021-05-16: qty 2, 2d supply, fill #0

## 2021-05-16 NOTE — Progress Notes (Signed)
 Answers for HPI/ROS submitted by the patient on 05/15/2021  altered mental status: No  clumsiness: No  focal sensory loss: Yes  focal weakness: No  loss of balance: No  memory loss: No  near-syncope: No  slurred speech: No  syncope: No  visual change: No  weakness: No  Chronicity: chronic  Onset: more than 1 month ago  Onset quality: gradually  Progression since onset: gradually worsening  Focality: lower extremity  abdominal pain: No  auditory change: No  aura: No  back pain: No  bladder incontinence: No  bowel incontinence: No  chest pain: No  confusion: No  diaphoresis: No  dizziness: No  fatigue: No  fever: No  headaches: No  light-headedness: No  nausea: No  neck pain: No  palpitations: No  shortness of breath: No  vertigo: No  vomiting: No  Treatments tried: nothing

## 2021-05-16 NOTE — H&P (Signed)
 [facility]  [address]  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[employer] and has enjoyed perfect seizure control since 2015, telling me that his early life history involved him having seizures starting around 8th grade, presumed absence, but then requiring multiple medication trials including Keppra(did not work, resulted in GTCS), Lamictal, Depakote, and ultimately remaining on homeopathic doses of LTG + Dilantin 400 mg/day no further events, but over last 1 + year or so-started to notice numbness of toes, balls of feet/ not hands, had B12/MMA, A1c all negative, thinking that although could have extensive testing for a peripheral neuropathy likely the cause for his symptoms would be dilantin, and would therefore like to consider changing over, no outside records, EEGs, dataset to review at this time but can be forwarded, no auras or prodromes, despite long work hours and stressors- no seizures of any sort at this time.     Past Medical History  He has no past medical history on file.    Surgical History  He has no past surgical history on file.     Social History  He reports that he has never smoked. He has never used smokeless tobacco. He reports that he does not drink alcohol and does not use drugs.    Family History  No family history on file.     Advance Care Plan  Extended Emergency Contact Information  Primary Emergency Contact: Parker,Wesley  Mobile Phone: [phone]  Relation: Spouse  Interpreter needed? No  No Order       Allergies  Amoxicillin     Medications    Current Outpatient Medications:   ?  phenytoin ER (Dilantin Extended) 100 mg  capsule, , Disp: , Rfl:   ?  diazePAM (Valtoco) 10 mg/spray (0.1 mL), Administer 2 sprays (10 mg) into affected nostril(s) if needed (PRN prolonged seizure > 5 minutes)., Disp: 2 each, Rfl: 1  ?  lamoTRIgine (LaMICtal) 25 mg tablet, , Disp: , Rfl:     Medications      Start Medication Dose/Rate, Route, Frequency Ordered Stop    05/16/21 0000 diazePAM (Valtoco) 10 mg/spray (0.1 mL)         10 mg, nasl, As needed 05/16/21 1404 --                Review of Systems    Objective   Physical Exam    Last Recorded Vitals  Blood pressure 138/75, pulse 80, height 1.88 m, weight 83 kg.     A/O EOMI PERRLA Discs sharp  Heart S1 S2 No bruits lungs CTA  Extr no CCE  A/O MSE normal CN 2/3/4/5/6/7/01/24/09/11/12 intact Motor 5/5 no midline ataxia, no Romberg DTRs 1-2 +  States has no objective sensory findings    Relevant Results       Assessment/Plan   Issues Addressed       Codes    Sensory neuropathy    -  Primary G62.9    Relevant Orders    Protein electrophoresis, serum    Protein electrophoresis, urine    Vitamin B6    Nonintractable epilepsy  without status epilepticus, unspecified epilepsy type (CMS/HCC)     G40.909    Relevant Medications    lamoTRIgine (LaMICtal) 25 mg tablet    phenytoin ER (Dilantin Extended) 100 mg capsule    diazePAM (Valtoco) 10 mg/spray (0.1 mL)    Other Relevant Orders    Phenytoin level, total          30 year old with well controlled idiopathic seizure disorder- may have IGE vs a focal disorder which could dictate what meds we try next/swap out, I think it highly probably likely his sensory symptoms are from Dilantin/phenytoin    PLAN : Although can consider EMG, extensive neuromuscular testing/serology, mixed connective tissue dx, cryoglobulins, other testing -we will do B6, SPEP, UPEP, hold on other tests for now, consider Zonegran, Vimpat, Briviact, Aptiom, other meds for swapping dilantin, Valtoco for breakthru, seizure/safety precautions, all questions answered, RV, Wesley Bryant MD   Lewis Moccasin,  MD

## 2021-05-17 LAB — PROTEIN ELECTROPHORESIS, URINE
Creatinine, urine, random (INT/EXT): 222.36 mg/dL (ref 24.00–392.00)
Protein, urine: 11 mg/dL (ref <15.0)
Protein/Creatinine ratio: 49 mg/g{creat} (ref <=200)

## 2021-05-17 LAB — PROTEIN ELECTROPHORESIS, SERUM
Albumin (SPEP): 4.49 g/dL (ref 3.50–5.10)
Alpha 1 (SPEP): 0.2 g/dL (ref 0.10–0.30)
Alpha 2 (SPEP): 0.97 g/dL (ref 0.50–1.00)
Beta (SPEP): 0.81 g/dL (ref 0.50–1.10)
Gamma Globulin (SPEP): 0.64 g/dL (ref 0.50–1.30)

## 2021-05-20 LAB — VITAMIN B6, PLASMA: Vitamin B6, Plasma: 7.4 ng/mL (ref 2.1–21.7)

## 2021-05-24 ENCOUNTER — Encounter

## 2021-05-26 ENCOUNTER — Ambulatory Visit: Payer: PRIVATE HEALTH INSURANCE | Attending: Geriatric Medicine | Primary: Internal Medicine

## 2021-05-26 ENCOUNTER — Other Ambulatory Visit

## 2021-05-30 ENCOUNTER — Encounter

## 2021-05-30 ENCOUNTER — Encounter (HOSPITAL_BASED_OUTPATIENT_CLINIC_OR_DEPARTMENT_OTHER)

## 2021-07-20 ENCOUNTER — Encounter (HOSPITAL_BASED_OUTPATIENT_CLINIC_OR_DEPARTMENT_OTHER)

## 2021-07-27 ENCOUNTER — Other Ambulatory Visit

## 2021-09-27 ENCOUNTER — Other Ambulatory Visit (INDEPENDENT_AMBULATORY_CARE_PROVIDER_SITE_OTHER): Payer: Self-pay | Admitting: Family

## 2021-09-27 DIAGNOSIS — G40309 Generalized idiopathic epilepsy and epileptic syndromes, not intractable, without status epilepticus: Secondary | ICD-10-CM

## 2021-09-29 NOTE — Telephone Encounter (Signed)
I refused the refill as he has a neurologist in [place]. TG ?

## 2021-10-04 ENCOUNTER — Telehealth (INDEPENDENT_AMBULATORY_CARE_PROVIDER_SITE_OTHER): Payer: Self-pay | Admitting: Family

## 2021-10-04 ENCOUNTER — Other Ambulatory Visit (INDEPENDENT_AMBULATORY_CARE_PROVIDER_SITE_OTHER): Payer: Self-pay | Admitting: Family

## 2021-10-04 DIAGNOSIS — G40309 Generalized idiopathic epilepsy and epileptic syndromes, not intractable, without status epilepticus: Secondary | ICD-10-CM

## 2021-10-04 NOTE — Telephone Encounter (Signed)
?  Name of who is calling: Rayson  ? ?Caller's Relationship to Patient: ?self ?Best contact number:[phone] ? ?Provider they TTS:VXBL Goodpasture  ? ?Reason for call: ?Patient hasnt gotten to see an adult nuerologist yet and wanted to know if Otila Kluver could fill script for Dilantin one last time.  ? ? ? ?PRESCRIPTION REFILL ONLY ? ?Name of prescription: ?Dilantin  ?[facility] [address]  ?[place], [place] ?[phone] ? ? ?

## 2021-10-04 NOTE — Telephone Encounter (Signed)
I called and told him that I will send in the Dilantin Rx. TG ?

## 2021-10-30 ENCOUNTER — Encounter

## 2021-10-30 ENCOUNTER — Telehealth
Admit: 2021-10-30 | Discharge: 2021-10-30 | Payer: PRIVATE HEALTH INSURANCE | Attending: Neurology | Primary: Internal Medicine

## 2021-10-30 DIAGNOSIS — R569 Unspecified convulsions: Secondary | ICD-10-CM

## 2021-10-30 MED ORDER — lacosamide (Vimpat) 50 mg tablet
50 | ORAL_TABLET | ORAL | 5 refills | Status: AC
Start: 2021-10-30 — End: ?

## 2021-10-30 NOTE — H&P (Signed)
[facility]  [address]  [place] [id number]  [phone]    Today's Date: 10/30/2021  MRN: [id number]  Name: Bella KennedyGary C Ciolek, MD  DOB: 05/26/1991    Chief Complaint  No chief complaint on file.    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 presenting with seizures from approximately 8th grate and as pre previously noted notes- he is a Medical illustratormedical intern here at [employer] and has enjoyed perfect seizure control since 2015, telling me that his early life history involved him having seizures starting around 8th grade, presumed absence, but then requiring multiple medication trials including Keppra(did not work, resulted in GTCS), Lamictal, Depakote, and ultimately remaining on homeopathic doses of LTG + Dilantin 400 mg/day no further events, but over last 1 + year or so-started to notice numbness of toes, balls of feet/ not hands, had B12/MMA, A1c all negative, thinking that although could have extensive testing for a peripheral neuropathy likely the cause for his symptoms would be dilantin, and would therefore like to consider changing over, no outside records, EEGs, dataset to review at this time but can be forwarded, no auras or prodromes, despite long work hours and stressors- no seizures of any sort at this time. We decided to proceed with switch to Vimpat.    This real-time, interactive virtual Telehealth encounter was done by video with the patient's verbal consent. Two patient identifiers were used and confirmed. Physical location of the patient: home Patient resides in: [place]  Physical location of the provider: office. Other participants/involvement: none  Total minutes spent: 41  with coordination of care     Past Medical History  He has no past medical history on file.    Surgical History  He has no past surgical history on file.     Social History  He reports that he has never  smoked. He has never used smokeless tobacco. He reports that he does not drink alcohol and does not use drugs.    Family History  No family history on file.     Advance Care Plan  Extended Emergency Contact Information  Primary Emergency Contact: Emami,Madison  Mobile Phone: [phone]  Relation: Spouse  Interpreter needed? No  No Order       Allergies  Amoxicillin     Medications    Current Outpatient Medications:   .  diazePAM (Valtoco) 10 mg/spray (0.1 mL), Administer 2 sprays (10 mg) into affected nostril(s) if needed (PRN prolonged seizure > 5 minutes)., Disp: 2 each, Rfl: 1  .  lamoTRIgine (LaMICtal) 25 mg tablet, , Disp: , Rfl:   .  phenytoin ER (Dilantin Extended) 100 mg capsule, , Disp: , Rfl:     Medications     None      Dilantin not LTG 400 mg/day    Review of Systems  Otherwise negative   Objective   Physical Exam Appears awake interactive pleasant, ambulatory     Last Recorded Vitals  There were no vitals taken for this visit.    Relevant Results        Assessment/Plan     31 year old with well controlled idiopathic seizure disorder- may have IGE vs a focal disorder which could dictate what meds we try next/swap out, I think it highly probably likely his sensory symptoms are from Dilantin/phenytoin, we decided on Vimpat as the next treatment  option which treats either IGE vs focal seizures.    PLAN : as above- titrate vimpat- seizure/safety precautions, all questions answered, RV, [name] MD   Lewis Moccasin, MD     Lewis Moccasin, MD        PLAN : Vimpat up titration : generally people get to 100 BID - max dose 400/day(tapering up from 50 daily X 1 week then 50 BID X 1 week or 2 weeks, then  100/50 for 1 or 2 weeks,  then 100/100 ) so over several weeks   Once at 100 BID can taper dilantin, will send this thru the portal for the patient, all questions answered RV 3 months [name] MD

## 2022-01-29 ENCOUNTER — Ambulatory Visit: Admit: 2022-01-29 | Payer: PRIVATE HEALTH INSURANCE | Attending: Neurology | Primary: Internal Medicine

## 2022-01-29 ENCOUNTER — Ambulatory Visit: Payer: PRIVATE HEALTH INSURANCE | Attending: Neurology | Primary: Internal Medicine

## 2022-01-29 DIAGNOSIS — R209 Unspecified disturbances of skin sensation: Secondary | ICD-10-CM

## 2022-01-29 NOTE — H&P (Signed)
[facility]  [address]  [address] [place] [address]  [phone]    Today's Date: 01/29/2022  MRN: [id number]  Name: Wesley Parker, Wesley Parker  DOB: 1990/10/12    Chief Complaint  No chief complaint on file.    Seizure disorder switch to Vimpat  Neck symptoms/numbness with neck movement in all 4 extremities UE + LE, and with arm movement b/l    No seizures    Unclear if side effects of meds vs this is coincidental, has a baseline neuropathy it seems probably from dilantin the above worse acutely with neck symptoms      History Of Present Illness  Wesley Parker, Wesley Parker is a 31 y.o. 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 enjoyed perfect seizure control since 2015, telling me that his early life history involved him having seizures starting around 8th grade, presumed absence, but then requiring multiple medication trials including Keppra(did not work, resulted in GTCS), Lamictal, Depakote, and ultimately remaining on homeopathic doses of LTG + Dilantin 400 mg/day no further events, but over last 1 + year or so-started to notice numbness of toes, balls of feet/ not hands, had B12/MMA, A1c all negative, thinking that although could have extensive testing for a peripheral neuropathy likely the cause for his symptoms would be dilantin, and would therefore like to consider changing over, no outside records, EEGs, dataset to review at this time but can be forwarded, no auras or prodromes, despite long work hours and stressors- no seizures of any sort at this time.  10/2021 we uptitrated Vimpat as noted, dcd dilantin, the above are noted      Past Medical History  He has no past medical history on file.  Seizure  Presumptive neuropathy from dilantin   Surgical History  He has no past surgical history on file.     Social History  He reports that he has never smoked. He has never used  smokeless tobacco. He reports that he does not drink alcohol and does not use drugs.    Family History  No family history on file.     Advance Care Plan  Extended Emergency Contact Information  Primary Emergency Contact: Posas,Madison  Mobile Phone: [phone]  Relation: Spouse  Interpreter needed? No  No Order       Allergies  Amoxicillin     Medications    Current Outpatient Medications:   .  diazePAM (Valtoco) 10 mg/spray (0.1 mL), Administer 2 sprays (10 mg) into affected nostril(s) if needed (PRN prolonged seizure > 5 minutes)., Disp: 2 each, Rfl: 1  .  lacosamide (Vimpat) 50 mg tablet, Sig 1 tab po daily X 1 week then sig 1 tab po BID x 1 week then sig 2 tabs po in am/ 1 tab in pm X 1 week then sig 2 tabs po bid, Disp: 120 tablet, Rfl: 5  .  lamoTRIgine (LaMICtal) 25 mg tablet, , Disp: , Rfl:   .  phenytoin ER (Dilantin Extended) 100 mg capsule, , Disp: , Rfl:     Medications     None          Review of Systems as above     Objective   Physical Exam  A/O EOMI PERRLA Marked Cervical spasm, numbness worse/brought about with any neck/cervical movement in cluding arm movements upward like an Adson's maneuver, no ataxia/tremor/  normal gait, unclear if any pulse deficit with maneuver, ambulatory NTinel, phelan, cubital ulnar groove symptoms  Last Recorded Vitals  There were no vitals taken for this visit.    Relevant Results    Assessment/Plan       31 year old with the above- of most concern is the development of neck pain/ spasm and worsening numbness/tingling in all 4 extremities so a cervical myelopathy requires expedient triage/exclusion    Will also order electrolytes to see if any outliers that can contribute to sensory symptoms, EMG to evaluate his neuropathy more formally, Vascular ultrasounds of the upper extremities to exclude Thoracic outlet,  Try to go to 100/50 on vimpat to see if symptoms improve or not- all questions answered, RV, [name] Wesley Parker     Wesley Parker, Wesley Parker

## 2022-02-15 NOTE — Telephone Encounter (Signed)
I called the patient to schedule an appointment for a Routine Annual Physical Exam(PE).  Thank you Charice Zuno- Population Health LCO-Team

## 2022-02-20 ENCOUNTER — Inpatient Hospital Stay: Admit: 2022-02-20 | Payer: PRIVATE HEALTH INSURANCE | Primary: Internal Medicine

## 2022-02-20 DIAGNOSIS — R202 Paresthesia of skin: Secondary | ICD-10-CM

## 2022-02-26 ENCOUNTER — Inpatient Hospital Stay
Admit: 2022-02-26 | Discharge: 2022-02-27 | Disposition: A | Discharge status: Home / Self Care | Payer: PRIVATE HEALTH INSURANCE | Attending: Student in an Organized Health Care Education/Training Program

## 2022-02-26 ENCOUNTER — Emergency Department: Admit: 2022-02-27 | Payer: PRIVATE HEALTH INSURANCE | Primary: Internal Medicine

## 2022-02-26 DIAGNOSIS — G40909 Epilepsy, unspecified, not intractable, without status epilepticus: Principal | ICD-10-CM

## 2022-02-26 DIAGNOSIS — R569 Unspecified convulsions: Secondary | ICD-10-CM

## 2022-02-26 MED ORDER — sodium chloride 0.9 % bolus 1,000 mL
0.9 | Freq: Once | INTRAVENOUS | Status: AC
Start: 2022-02-26 — End: 2022-02-26
  Administered 2022-02-27: 1000 mL via INTRAVENOUS

## 2022-02-26 NOTE — Discharge Instructions (Addendum)
You were seen in the emergency department after having a seizure. Your labs and head CT were reassuring. Your white blood cell count was high at 13 but otherwise labs were normal.   Neurology has recommended taking Vimpat 150 mg twice a day and Dilantin 400 mg daily.   You should follow up with Dr. Roxy Manns in one week.   You should NOT drive.     Please return to the emergency department with any concerns or worsening symptoms.

## 2022-02-26 NOTE — ED Progress Note (Signed)
Provider in Triage Note    31 y.o. male with a PMH of Epilepsy who presents to the ED with complaints of loss of consciousness. Patient states he was on his [place] and then woke up on the ground. Endorses oral trauma. Last seizure was 8 years ago. He takes Vimpat twice a day, was recently taken off Lamictal and phenytoin due to peripheral neuropathy. Denies fever, chills.      Physical Exam: well appearing in NAD. Oral trauma to left side of tongue, PERRL, ambulates with and even steady gait     I have seen and evaluated this patient in triage.     Caro HightHannah Jorita Bohanon, PA        Caro HightHannah Janaa Acero, [place]PA  02/26/22 1944
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
## 2022-02-26 NOTE — ED Notes (Signed)
Pt states feeling better , denies dizziness, VSS. For discharge after being reviewed by the ED provider, pt agreed to it . DC instruction given ,  verbalized understanding . Left ED  ambulatory with steady gait with a  friend.      Sherian MaroonLailanie Loys Shugars, RN  02/26/22 2155
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
## 2022-02-27 LAB — COMPREHENSIVE METABOLIC PANEL
ALT: 33 U/L (ref 0–55)
AST: 35 U/L (ref 6–42)
Albumin: 4.8 g/dL (ref 3.2–5.0)
Alkaline phosphatase: 108 U/L (ref 30–130)
Anion Gap: 12 mmol/L (ref 3–14)
BUN: 19 mg/dL (ref 6–24)
Bilirubin, total: 0.3 mg/dL (ref 0.2–1.2)
CO2 (Bicarbonate): 24 mmol/L (ref 20–32)
Calcium: 9.9 mg/dL (ref 8.5–10.5)
Chloride: 103 mmol/L (ref 98–110)
Creatinine: 1.21 mg/dL (ref 0.55–1.30)
Glucose: 129 mg/dL (ref 70–139)
Potassium: 3.7 mmol/L (ref 3.6–5.2)
Protein, total: 7.4 g/dL (ref 6.0–8.4)
Sodium: 139 mmol/L (ref 135–146)
eGFRcr: 83 mL/min/{1.73_m2} (ref >=60)

## 2022-02-27 LAB — SDS, SERUM DRUG SCREEN
Acetaminophen level: <5 ug/mL (ref <=30)
Benzodiazepines screen, serum: NOT DETECTED
Ethanol level, plasma/serum: <10 mg/dL (ref <=10)
Salicylate level: <0.5 mg/dL (ref 0.0–30.0)
Tricyclics screen, serum: NOT DETECTED mg/dL

## 2022-02-27 LAB — CBC WITH DIFFERENTIAL
Basophils %: 0.5 %
Basophils Absolute: 0.07 10*3/uL (ref 0.00–0.22)
Eosinophils %: 0.6 %
Eosinophils Absolute: 0.08 10*3/uL (ref 0.00–0.50)
Hematocrit: 46.2 % (ref 37.0–53.0)
Hemoglobin: 16 g/dL (ref 13.0–17.5)
Immature Granulocytes %: 1 %
Immature Granulocytes Absolute: 0.13 10*3/uL — ABNORMAL HIGH (ref 0.00–0.10)
Lymphocyte %: 12.6 %
Lymphocytes Absolute: 1.69 10*3/uL (ref 0.70–4.00)
MCH: 29.1 pg (ref 26.0–34.0)
MCHC: 34.6 g/dL (ref 31.0–37.0)
MCV: 84.2 fL (ref 80.0–100.0)
MPV: 9.7 fL (ref 9.1–12.4)
Monocytes %: 6.3 %
Monocytes Absolute: 0.85 10*3/uL — ABNORMAL HIGH (ref 0.38–0.83)
NRBC %: 0 % (ref 0.0–0.0)
NRBC Absolute: 0 10*3/uL (ref 0.00–2.00)
Neutrophil %: 79 %
Neutrophils Absolute: 10.61 10*3/uL — ABNORMAL HIGH (ref 1.50–7.95)
Platelets: 395 10*3/uL (ref 150–400)
RBC: 5.49 M/uL (ref 4.20–5.90)
RDW-CV: 12.2 % (ref 11.5–14.5)
RDW-SD: 36.8 fL (ref 35.0–51.0)
WBC: 13.4 10*3/uL — ABNORMAL HIGH (ref 4.0–11.0)

## 2022-02-27 LAB — POCT GLUCOSE
Glucose Blood, POC: 120 mg/dL (ref 70–139)
POCT Glucose: 120 mg/dL (ref 70–139)

## 2022-02-28 ENCOUNTER — Ambulatory Visit
Admit: 2022-02-28 | Discharge: 2022-02-28 | Payer: PRIVATE HEALTH INSURANCE | Attending: Neurology | Primary: Internal Medicine

## 2022-02-28 DIAGNOSIS — R569 Unspecified convulsions: Secondary | ICD-10-CM

## 2022-02-28 NOTE — H&P (Signed)
[facility]  [address]  [place] [address]  [phone]    Today's Date: 02/28/2022  MRN: [id number]  Name: Wesley Kennedy, Wesley Parker  DOB: Apr 05, 1991    Chief Complaint  No chief complaint on file.      History Of Present Illness  Wesley Harper, Wesley Parker is a 31 y.o. male presenting with breakthrough seizure activity.  9/11 presented to the ER with loss of consciousness. Patient states he was on his [place] and then woke up on the ground. Endorses oral trauma. Last seizure was 8 years ago. He takes Vimpat twice a day - 100 BID, was recently taken off Lamictal and phenytoin due to peripheral neuropathy. Er treated and released, CT head negative, No other cardiopulmonary issues, Neuro: Denies syncope, now back on Pht X 2 days. We discussed case and it seems prudent to escalate vimpat to 150 BID and maybe 200 BID, stop dilantin as he had stopped pht due to side effects and breakthrough seizures may also be mitigated potentially on vimpat monotherapy with increasing dose potentially, course of appointment-reviewed Head CT/ MRI C spine, rationale of the above, all other symptoms for which he presented a few weeks ago have now abated/upper extremity and all 4 extremity numbness.      I noted previously :  has enjoyed perfect seizure control since 2015, telling me that his early life history involved him having seizures starting around 8th grade, presumed absence, but then requiring multiple medication trials including Keppra(did not work, resulted in GTCS), Lamictal, Depakote, and ultimately remaining on homeopathic doses of LTG + Dilantin 400 mg/day no further events, but over last 1 + year or so-started to notice numbness of toes, balls of feet/ not hands, had B12/MMA, A1c all negative, thinking that although could have extensive testing for a peripheral neuropathy likely the cause for his symptoms would be dilantin, and would therefore like to consider changing over, no outside records, EEGs,  dataset to review at this time but can be forwarded, no auras or prodromes, despite long work hours and stressors- no seizures of any sort at this time.  10/2021 we uptitrated Vimpat as noted, dcd dilantin, the above are noted           Past Medical History  He has a past medical history of Seizure (CMS/HCC).    Surgical History  He has no past surgical history on file.     Social History  He reports that he has never smoked. He has never used smokeless tobacco. He reports that he does not drink alcohol and does not use drugs.    Family History  No family history on file.     Advance Care Plan  Extended Emergency Contact Information  Primary Emergency Contact: Hardigree,Madison  Mobile Phone: [phone]  Relation: Spouse  Interpreter needed? No  No Order       Allergies  Amoxicillin     Medications    Current Outpatient Medications:   .  diazePAM (Valtoco) 10 mg/spray (0.1 mL), Administer 2 sprays (10 mg) into affected nostril(s) if needed (PRN prolonged seizure > 5 minutes)., Disp: 2 each, Rfl: 1  .  lacosamide (Vimpat) 50 mg tablet, Sig 1 tab po daily X 1 week then sig 1 tab po BID x 1 week then sig 2 tabs po in am/ 1 tab in pm X 1 week then sig 2 tabs po bid (Patient taking differently: Take 100 mg by mouth twice daily. Sig 1 tab po daily  X 1 week then sig 1 tab po BID x 1 week then sig 2 tabs po in am/ 1 tab in pm X 1 week then sig 2 tabs po bid), Disp: 120 tablet, Rfl: 5  .  lamoTRIgine (LaMICtal) 25 mg tablet, , Disp: , Rfl:   .  phenytoin ER (Dilantin Extended) 100 mg capsule, , Disp: , Rfl:     Medications     None          Review of Systems  ROS otherwise negative  Objective   Physical Exam    Last Recorded Vitals  There were no vitals taken for this visit.    Relevant Results     Narrative & Impression   NAME: Wesley Parker  DOB: December 24, 1990 AGE: 79 years  GENDER: Male  MRN: [id number]  ORD PHYS: SONYA O'NEILL  LOCATION: [facility]  02/26/2022 8:35 PM EDT  [id number] (CT HEAD WO CONTRAST)  [id number]     HISTORY: seizure, possible headstrike    TECHNIQUE:  Serial axial images were obtained on a multidetector CT through the head without the use of intravenous contrast.   Multiplanar 2D MPR reformatted images were then obtained.    Radiation optimization: All CT scans at this facility use at least one of these dose optimization techniques: automated exposure control; [place] and/or kV adjustment per patient size (includes targeted exams where dose is matched to clinical indication); or iterative reconstruction.    COMPARISON: None    FINDINGS:    The ventricles, sulci and cisterns are age-appropriate. There is no mass-effect, midline shift, or space-occupying lesion. There is no acute intracranial hemorrhage or extra-axial fluid collection.  There is no acute territorial infarct.    There is no displaced calvarial fracture.    IMPRESSION:    No acute intracranial abnormality.    APPROVED BY STAFF RADIOLOGIST: Ellyn Hack 02/26/2022 9:06 PM EDT     Result History    CT HEAD WO CONTRAST (Order #[id number]) on 02/26/2022 - Order Result History Report  Breast Imaging Recommendations  Wesley Harper, Wesley Parker  No recommendations exist for this order.  Signed by    Signed Date/Time  Phone Pager   Ellyn Hack 02/26/2022 21:06 [phone]      Exam Information    Status Exam Begun Exam Ended   Final 02/26/2022 20:25 02/26/2022 20:35     External Results Report    Open External Results Report    Encounter    View Encounter            IR Documentation Timeline (02/28/2022 14:07:35 to 02/28/2022 14:07:35)    02/26/2022   02/26/2022 Event Details User   19:44 In Facility Status: Arrived --             Screening Form Questions    No questions have been answered for this form.         CT HEAD WO CONTRAST  Order: [id number]   Status: Final result       Visible to patient: Yes (seen)       Next appt: None       0 Result Notes      Details    Reading Physician Reading Date Result Priority   Ellyn Hack, Wesley Parker  [id number]  02/26/2022      Narrative & Impression  NAME: Wesley Parker  DOB: 28-Nov-1990 AGE: 79 years  GENDER: Male  MRN: [id number]  ORD PHYS: SONYA O'NEILL  LOCATION: [facility]  02/26/2022  8:35 PM EDT  [id number] (CT HEAD WO CONTRAST) [id number]     HISTORY: seizure, possible headstrike    TECHNIQUE:  Serial axial images were obtained on a multidetector CT through the head without the use of intravenous contrast.   Multiplanar 2D MPR reformatted images were then obtained.    Radiation optimization: All CT scans at this facility use at least one of these dose optimization techniques: automated exposure control; [place] and/or kV adjustment per patient size (includes targeted exams where dose is matched to clinical indication); or iterative reconstruction.    COMPARISON: None    FINDINGS:    The ventricles, sulci and cisterns are age-appropriate. There is no mass-effect, midline shift, or space-occupying lesion. There is no acute intracranial hemorrhage or extra-axial fluid collection.  There is no acute territorial infarct.    There is no displaced calvarial fracture.    IMPRESSION:    No acute intracranial abnormality.    APPROVED BY STAFF RADIOLOGIST: Ellyn Hack 02/26/2022 9:06 PM EDT             Specimen Collected: 02/26/22 21:03 Last Resulted: 02/26/22 21:06        Order Details       View Encounter       Lab and Collection Details       Routing       Result History      View All Conversations on this Encounter            Result Care Coordination        Patient Communication     Add Comments  Seen Back to              Study Result    Narrative & Impression   NAME: Wesley Parker  DOB: [birth year] AGE: 10 years  GENDER: Male  MRN: [id number]  ORD PHYS: Lewis Moccasin  LOCATION: [facility]  02/20/2022 8:44 AM EDT  [id number] (MR CERVICAL SPINE WO CONTRAST) [id number]     MR CERVICAL SPINE WO CONTRAST    Indication: paresthesia    Technique: Multiplanar, multisequential MRI images of cervical spine were  obtained without injection of contrast.     Findings:    There is no appropriate prior study for comparison. The sensitivity of the study has been decreased due to motion artifact.     There is normal physiologic cervical lordosis. The vertebral heights are relatively preserved.    The cervicomedullary junction is unremarkable.     No definite signal abnormality within the spinal cord is noted.      There are no significant disc osteophyte complex associated with uncovertebral joint arthrosis from C3 to T1.    No significant neuroforaminal narrowing or canal stenosis at the level of C2-C3 is noted.    At the level of C3-C4, there is mild bilateral neuroforaminal narrowing and no canal stenosis.    At the level of C4-C5, there is mild bilateral neuroforaminal narrowing and no canal stenosis.    At the level of C5-C6, there is mild bilateral neuroforaminal narrowing and no canal stenosis.    At the level of C6-C7, there is mild-to-moderate bilateral neuroforaminal narrowing and no canal stenosis.    Level of C7-T1 is unremarkable.    No definite muscular or ligamentous injury is noted.    IMPRESSION:  Impression:    Limited study due to significant motion artifact. 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 No definite canal stenosis.    APPROVED BY STAFF RADIOLOGIST: Cristie Hem 02/20/2022 9:36 AM EDT     Result History    MR CERVICAL SPINE WO CONTRAST (Order #[id number]) on 02/20/2022 - Order Result History Report - Result Edited    Breast Imaging Recommendations  Wesley Harper, Wesley Parker  No recommendations exist for this order.  Signed by    Signed Date/Time  Phone Pager   Cristie Hem 02/20/2022 09:36 [phone]      Exam Information    Status Exam Begun Exam Ended   Final 02/20/2022 08:09 02/20/2022 08:44     External Results Report    Open External Results Report    Encounter    View Encounter            MR CERVICAL  SPINE WO CONTRAST  Order: [id number]   Status: Final result     Visible to patient: Yes (seen)     Next appt: None     Dx: Paresthesias; Weakness     0 Result Notes  Details    Reading Physician Reading Date Result Priority   Cristie Hem, Wesley Parker  [phone] 02/20/2022 Routine     Narrative & Impression  NAME: Wesley Parker  DOB: December 27, 1990 AGE: 27 years  GENDER: Male  MRN: [id number]  ORD PHYS: Lewis Moccasin  LOCATION: [facility]  02/20/2022 8:44 AM EDT  [id number] (MR CERVICAL SPINE WO CONTRAST) [id number]     MR CERVICAL SPINE WO CONTRAST    Indication: paresthesia    Technique: Multiplanar, multisequential MRI images of cervical spine were obtained without injection of contrast.     Findings:    There is no appropriate prior study for comparison. The sensitivity of the study has been decreased due to motion artifact.     There is normal physiologic cervical lordosis. The vertebral heights are relatively preserved.    The cervicomedullary junction is unremarkable.     No definite signal abnormality within the spinal cord is noted.      There are no significant disc osteophyte complex associated with uncovertebral joint arthrosis from C3 to T1.    No significant neuroforaminal narrowing or canal stenosis at the level of C2-C3 is noted.    At the level of C3-C4, there is mild bilateral neuroforaminal narrowing and no canal stenosis.    At the level of C4-C5, there is mild bilateral neuroforaminal narrowing and no canal stenosis.    At the level of C5-C6, there is mild bilateral neuroforaminal narrowing and no canal stenosis.    At the level of C6-C7, there is mild-to-moderate bilateral neuroforaminal narrowing and no canal stenosis.    Level of C7-T1 is unremarkable.    No definite muscular or ligamentous injury is noted.    IMPRESSION:  Impression:    Limited study due to significant motion artifact. Mild degenerative changes of the cervical spine in particular at C6-C7 with mild to moderate  bilateral neural foraminal narrowing. No definite canal stenosis.    APPROVED BY STAFF RADIOLOGISTCristie Hem 02/20/2022 9:36 AM EDT           Specimen Collected: 02/20/22 09:26 Last Resulted: 02/20/22 09:36        Order Details     View Encounter     Lab and Collection Details     Routing                Assessment/Plan     31 year old with the above- see  course of appointment, seizure/safety /RMV precautions, escalate Vimpat, DC pht/ RV/ all questions answered he will keep me updated on how he is doing [name] Wesley Parker   Lewis Moccasin, Wesley Parker

## 2022-03-16 MED ORDER — lacosamide (Vimpat) 150 mg tablet tablet
150 | ORAL_TABLET | Freq: Two times a day (BID) | ORAL | 1 refills | Status: DC
Start: 2022-03-16 — End: 2022-03-16
  Filled 2022-03-16: qty 120, 60d supply, fill #0

## 2022-03-16 MED ORDER — lacosamide (Vimpat) 150 mg tablet tablet
150 | ORAL_TABLET | Freq: Two times a day (BID) | ORAL | 1 refills | Status: DC
Start: 2022-03-16 — End: 2022-03-16

## 2022-03-16 MED ORDER — lacosamide (Vimpat) 150 mg tablet tablet
150 | ORAL_TABLET | Freq: Two times a day (BID) | ORAL | 1 refills | Status: DC
Start: 2022-03-16 — End: 2022-05-08

## 2022-03-16 NOTE — Telephone Encounter (Signed)
Did new script Alvino ChapelJO

## 2022-05-08 MED ORDER — lacosamide (Vimpat) 150 mg tablet tablet
150 | ORAL_TABLET | Freq: Two times a day (BID) | ORAL | 1 refills | Status: AC
Start: 2022-05-08 — End: ?
  Filled 2022-05-08: qty 180, 90d supply, fill #0

## 2022-08-05 ENCOUNTER — Inpatient Hospital Stay
Admit: 2022-08-05 | Discharge: 2022-08-05 | Disposition: A | Discharge status: Home / Self Care | Payer: BLUE CROSS/BLUE SHIELD | Attending: Emergency Medicine

## 2022-08-05 DIAGNOSIS — G40919 Epilepsy, unspecified, intractable, without status epilepticus: Principal | ICD-10-CM

## 2022-08-05 LAB — MANUAL DIFFERENTIAL - SYSMEX WAM
Basophils %: 1 %
Basophils Absolute: 0.15 10*3/uL (ref 0.00–0.22)
Eosinophils %: 0 %
Eosinophils Absolute: 0 10*3/uL (ref 0.00–0.50)
Lymphocytes %: 50 %
Lymphocytes Absolute: 7.71 10*3/uL — ABNORMAL HIGH (ref 0.70–4.00)
Monocytes %: 6 %
Monocytes Absolute: 0.93 10*3/uL — ABNORMAL HIGH (ref 0.38–0.83)
Neutrophils %: 40 %
Neutrophils Absolute: 6.17 10*3/uL (ref 1.50–7.95)
Reactive Lymphocytes %: 3 %
Reactive Lymphs Absolute: 0.46 10*3/uL — ABNORMAL HIGH (ref 0.00–0.00)
Total WBC Counted: 115

## 2022-08-05 LAB — CBC WITH DIFFERENTIAL
Hematocrit: 52.9 % (ref 37.0–53.0)
Hemoglobin: 16.8 g/dL (ref 13.0–17.5)
MCH: 28.7 pg (ref 26.0–34.0)
MCHC: 31.8 g/dL (ref 31.0–37.0)
MCV: 90.4 fL (ref 80.0–100.0)
MPV: 9.8 fL (ref 9.1–12.4)
NRBC %: 0 % (ref 0.0–0.0)
NRBC Absolute: 0 10*3/uL (ref 0.00–2.00)
Platelets: 499 10*3/uL — ABNORMAL HIGH (ref 150–400)
RBC: 5.85 M/uL (ref 4.20–5.90)
RDW-CV: 12.2 % (ref 11.5–14.5)
RDW-SD: 40.4 fL (ref 35.0–51.0)
WBC: 15.4 10*3/uL — ABNORMAL HIGH (ref 4.0–11.0)

## 2022-08-05 LAB — COMPREHENSIVE METABOLIC PANEL
ALT: 40 U/L (ref 0–55)
AST: 46 U/L — ABNORMAL HIGH (ref 6–42)
Albumin: 5.1 g/dL — ABNORMAL HIGH (ref 3.2–5.0)
Alkaline phosphatase: 111 U/L (ref 30–130)
Anion Gap: 37 mmol/L — ABNORMAL HIGH (ref 3–14)
BUN: 16 mg/dL (ref 6–24)
Bilirubin, total: 0.5 mg/dL (ref 0.2–1.2)
CO2 (Bicarbonate): 10 mmol/L — CL (ref 20–32)
Calcium: 10.7 mg/dL — ABNORMAL HIGH (ref 8.5–10.5)
Chloride: 96 mmol/L — ABNORMAL LOW (ref 98–110)
Creatinine: 1.27 mg/dL (ref 0.55–1.30)
Glucose: 152 mg/dL — ABNORMAL HIGH (ref 70–139)
Potassium: 3.4 mmol/L — ABNORMAL LOW (ref 3.6–5.2)
Protein, total: 8.4 g/dL (ref 6.0–8.4)
Sodium: 143 mmol/L (ref 135–146)
eGFRcr: 78 mL/min/{1.73_m2} (ref >=60)

## 2022-08-05 LAB — BASIC METABOLIC PANEL
Anion Gap: 11 mmol/L (ref 3–14)
BUN: 14 mg/dL (ref 6–24)
CO2 (Bicarbonate): 23 mmol/L (ref 20–32)
Calcium: 9.2 mg/dL (ref 8.5–10.5)
Chloride: 105 mmol/L (ref 98–110)
Creatinine: 1.05 mg/dL (ref 0.55–1.30)
Glucose: 128 mg/dL (ref 70–139)
Potassium: 3.5 mmol/L — ABNORMAL LOW (ref 3.6–5.2)
Sodium: 139 mmol/L (ref 135–146)
eGFRcr: 98 mL/min/{1.73_m2} (ref >=60)

## 2022-08-05 LAB — LIGHT BLUE TOP

## 2022-08-05 LAB — POCT GLUCOSE: POCT Glucose: 133 mg/dL (ref 70–139)

## 2022-08-05 LAB — LACTIC ACID
Lactic acid: 21.8 mmol/L — ABNORMAL HIGH (ref 0.5–2.2)
Lactic acid: 1.4 mmol/L (ref 0.5–2.2)

## 2022-08-05 LAB — TROPONIN T, HIGH SENSITIVITY: Troponin T, High Sensitivity: 6 ng/L (ref <12)

## 2022-08-05 MED ORDER — sodium chloride 0.9 % bolus 1,000 mL
0.9 | Freq: Once | INTRAVENOUS | Status: AC
Start: 2022-08-05 — End: 2022-08-05
  Administered 2022-08-05: 13:00:00 1000 mL via INTRAVENOUS

## 2022-08-05 MED ORDER — sodium chloride 0.9 % bolus 1,000 mL
0.9 | Freq: Once | INTRAVENOUS | Status: AC
Start: 2022-08-05 — End: 2022-08-05
  Administered 2022-08-05: 15:00:00 1000 mL via INTRAVENOUS

## 2022-08-05 MED ORDER — lacosamide (Vimpat) 150 mg tablet tablet
150 | ORAL_TABLET | Freq: Two times a day (BID) | ORAL | 0 refills | Status: AC
Start: 2022-08-05 — End: 2022-09-08
  Filled 2022-08-05: qty 60, 30d supply, fill #0

## 2022-08-05 MED ORDER — lacosamide (Vimpat) injection 150 mg
200 | Freq: Once | INTRAVENOUS | Status: AC
Start: 2022-08-05 — End: 2022-08-05
  Administered 2022-08-05: 13:00:00 150 mg via INTRAVENOUS

## 2022-08-05 MED FILL — LACOSAMIDE 200 MG/20 ML INTRAVENOUS SOLUTION: 200 200 mg/20 mL | INTRAVENOUS | Qty: 20

## 2022-08-05 NOTE — ED Triage Notes (Signed)
Per medical staff witnessed seizure in on call room.  Hx seizures on Dilantin, arrives awake and alert

## 2022-08-05 NOTE — ED Triage Notes (Signed)
Pt brought down from [place] [facility] s/p witnessed tonic-clonic seizure activity lasting approx 3-76min. Pt was seated in a chair and was lowered to ground. Reportedly was looking towards the right, grunting, and arm above his head. Post-ictal following. No signs of incontinence or oral trauma. Pt reports hx of seizures, reportedly compliant with meds.

## 2022-08-05 NOTE — Other (Signed)
Patient Education  Table of Contents   Seizure, Adult    To view videos and all your education online visit,  [email or web address].[email or web address]  or scan this QR code with your smartphone.  Access to this content will expire in one year.  Seizure, Adult  A seizure is a sudden burst of abnormal electrical and chemical activity in the brain. Seizures usually last from 30 seconds to 2 minutes. The abnormal activity temporarily interrupts normal brain function.  Many types of seizures can affect adults. A seizure can cause many different symptoms depending on where in the brain it starts.  What are the causes?  Common causes of this condition include:   Fever or infection.   Brain injury, head trauma, bleeding in the brain, or a brain tumor.   Low levels of blood sugar or salt (sodium).   Kidney problems or liver problems.   Metabolic disorders or other conditions that are passed from parent to child (are inherited).   Reaction to a substance, such as a drug or a medicine, or suddenly stopping the use of a substance (withdrawal).   A stroke.   Developmental disorders such as autism spectrum disorder or cerebral palsy.  In some cases, the cause of a seizure may not be known. Some people who have a seizure never have another one. A person who has repeated seizures over time without a clear cause has a condition called epilepsy.  What increases the risk?  You are more likely to develop this condition if:   You have a family history of epilepsy.   You have had a tonic?clonic seizure before. This type of seizure causes tightening (contraction) of the muscles of the whole body and loss of consciousness.   You have a history of head trauma, lack of oxygen at birth, or strokes.  What are the signs or symptoms?  There are many different types of seizures. The symptoms vary depending on the type of seizure you have. Symptoms occur during the seizure. They may also occur before a seizure (aura) and after a seizure (postictal).  Symptoms may include the following:  Symptoms during a seizure   Uncontrollable shaking (convulsions) with fast, jerky movements of muscles.   Stiffening of the body.   Breathing problems.   Confusion, staring, or unresponsiveness.   Head nodding, eye blinking or fluttering, or rapid eye movements.   Drooling, grunting, or making clicking sounds with your mouth.   Loss of bladder control and bowel control.  Symptoms before a seizure   Fear or anxiety.   Nausea.   Vertigo. This is a feeling like:  ? You are moving when you are not.  ? Your surroundings are moving when they are not.   D?j vu. This is a feeling of having seen or heard something before.   Odd tastes or smells.   Changes in vision, such as seeing flashing lights or spots.  Symptoms after a seizure   Confusion.   Sleepiness.   Headache.   Sore muscles.  How is this diagnosed?  This condition may be diagnosed based on:   A description of your symptoms. Video of your seizures can be helpful.   Your medical history.   A physical exam.  You may also have tests, including:   Blood tests.   CT scan.   MRI.   Electroencephalogram (EEG). This test measures electrical activity in the brain. An EEG can predict whether seizures will return.   A spinal tap, also called a lumbar  puncture. This is the removal and testing of fluid that surrounds the brain and spinal cord.  How is this treated?  Most seizures will stop on their own in less than 5 minutes, and no treatment is needed. Seizures that last longer than 5 minutes will usually need treatment.  Seizures may be treated with:   Medicines given through an IV.   Avoiding known triggers, such as medicines that you take for another condition.   Medicines to control seizures or prevent future seizures (antiepileptics), if epilepsy caused your seizures.   Medical devices to prevent and control seizures.   Surgery to stop seizures or to reduce how often seizures happen, if you have epilepsy that does not respond to  medicines.   A diet low in carbohydrates and high in fat (ketogenic diet).  Follow these instructions at home:  Medicines   Take over-the-counter and prescription medicines only as told by your health care provider.   Avoid any substances that may prevent your medicine from working properly, such as alcohol.  Activity   Follow instructions about activities, such as driving or swimming, that would be dangerous if you had another seizure. Wait until your health care provider says it is safe to do them.   If you live in the [place] check with your local department of motor vehicles [facility]) to find out about local driving laws. Each state has specific rules about when you can legally drive again.   Get enough rest. Lack of sleep can make seizures more likely to occur.  Educating others     Teach friends and family what to do if you have a seizure. They should:  ? Help you get down to the ground, to prevent a fall.  ? Cushion your head and move items away from your body.  ? Loosen any tight clothing around your neck.  ? Turn you on your side. If you vomit, this helps keep your airway clear.  ? Know whether or not you need emergency care.  ? Stay with you until you recover.   Also, tell them what not to do if you have a seizure. Tell them:  ? They should not hold you down. Holding you down will not stop the seizure.  ? They should not put anything in your mouth.  General instructions   Avoid anything that has ever triggered a seizure for you.   Keep a seizure diary. Record what you remember about each seizure, especially anything that might have triggered it.   Keep all follow-up visits. This is important.  Contact a health care provider if:   You have another seizure or seizures. Call each time you have a seizure.   Your seizure pattern changes.   You continue to have seizures with treatment.   You have symptoms of an infection or illness. Either of these might increase your risk of having a seizure.   You are unable to take  your medicine.  Get help right away if:   You have:  ? A seizure that does not stop after 5 minutes.  ? Several seizures in a row without a complete recovery between seizures.  ? A seizure that makes it harder to breathe.  ? A seizure that leaves you unable to speak or use a part of your body.   You do not wake up right away after a seizure.   You injure yourself during a seizure.   You have confusion or pain right after a seizure.  These symptoms may represent a serious problem that is an emergency. Do not wait to see if the symptoms will go away. Get medical help right away. Call your local emergency services (911 in the [place] Do not drive yourself to the hospital.  Summary   Seizures are caused by abnormal electrical and chemical activity in the brain. The activity disrupts normal brain function and can cause various symptoms.   Seizures have many causes, including illness, head injuries, low levels of blood sugar or salt, and certain conditions.   Most seizures will stop on their own in less than 5 minutes. Seizures that last longer than 5 minutes are a medical emergency and need treatment right away.   Many medicines are used to treat seizures. Take over-the-counter and prescription medicines only as told by your health care provider.  This information is not intended to replace advice given to you by your health care provider. Make sure you discuss any questions you have with your health care provider.  Document Released: 2000-06-01 Document Updated: 2019-12-11 Document Reviewed: 2019-12-11  Elsevier Patient Education ? [place].

## 2022-08-05 NOTE — ED Notes (Signed)
Small wound to right aspect of tongue     De Burrs, RN  08/05/22 [phone]

## 2022-08-05 NOTE — ED Provider Notes (Signed)
EMERGENCY DEPARTMENT ENCOUNTER    ATTENDING NOTE    Date of service: 08/05/2022  8:15 AM    Chief Complaint   Patient presents with   . Seizures       Nursing notes reviewed and agreed with unless modified below.    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 who presents after witnessed seizure just PTA.  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 He was seated in the [facility] resident room this AM (after night shift) when he suddenly looked off to the right, then his arm began to raise up and he developed generalized tonic clonic seizure activity x 3-4 minutes (timed by co-residents that witnessed the even). He was lowered to the ground and Code Blue called and patient brought to ED by response team.   On arrival to ED, patient appears fatigued, postictal, alert and able to follow simple commands but slow. A small tongue laceration noted on right side of tongue. Patient able to report that he has a h/o seizures, missed dose of medication last night.     Medical record reviewed:  Neurology note 02/28/22 - seizure on 02/26/22 while riding [employer], evaluated in ED and discharged. Last seizure prior to this was 8 years before. Patient on Vimpat.     REVIEW OF SYSTEMS  Limited due to altered mental status  CONST: No recent fever;   NECK: no pain or stiffness;   CV: no chest pains;   RESP: no cough;   ABD/GI: no abdominal pain, vomiting;   NEURO: +altered mental status, seizure, headache;   ALL/IMM: no pruritus, rash/hives    PAST MEDICAL HISTORY / PROBLEM LIST    Past Medical History:   Diagnosis Date   . Seizure (CMS-HCC) (HHS-HCC)        There is no problem list on file for this patient.      PAST SURGICAL HISTORY    History reviewed. No pertinent surgical history.    MEDICATIONS     Discharge Medication List as of 08/05/2022 12:21 PM      CONTINUE these medications which have NOT CHANGED    Details   diazePAM  (Valtoco) 10 mg/spray (0.1 mL) Administer 2 sprays (10 mg) into affected nostril(s) if needed (PRN prolonged seizure > 5 minutes)., Starting Tue 05/16/2021, Normal      !! lacosamide (Vimpat) 150 mg tablet tablet Take 1 tablet (150 mg) by mouth twice daily., Starting Tue 05/08/2022, Normal      phenytoin ER (Dilantin Extended) 100 mg capsule Starting Fri 06/18/2014, Historical Med       !! - Potential duplicate medications found. Please discuss with provider.           ALLERGIES    Allergies   Allergen Reactions   . Amoxicillin Itching       SOCIAL HISTORY  Lives at home with spouse, No tobacco, alcohol.  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    PHYSICAL EXAM    ED Triage Vitals [08/05/22 0819]   Temp Pulse Resp BP   37.1 C (98.8 F) 119 18 (!) 143/60      SpO2 Temp Source Heart Rate Source Patient Position   95 % Oral Monitor Sitting      BP Location FiO2 (%)     Left arm --  General: Mildly pale, nontoxic.  Head: Atraumatic  Eyes: PERRL, EOMs intact, clear conjunctivae.  ENT: Moist oral mucosa. Normal posterior oropharynx. Small laceration to lateral aspect of right tongue  Neck: Supple, trachea midline, no cervical lymphadenopathy. No meningismus   Respiratory: No respiratory distress, clear lungs  CVS: RRR, normal S1 and S2, no murmurs  Abdomen: Soft, non-tender, non-distended.  Back: No midline tenderness,  Extremities: moves all extremities well, no edema.  Skin: Normal temperature, brisk capillary refill, no rash.  Neuro: Alert and oriented to self, place, no focal deficits, strength 5/5 in all extremities    LABS    Labs Reviewed   COMPREHENSIVE METABOLIC PANEL - Abnormal       Result Value    Sodium 143      Potassium 3.4 (*)     Chloride 96 (*)     CO2 (Bicarbonate) 10 (*)     Anion Gap 37 (*)     BUN 16      Creatinine 1.27      eGFRcr 78      Glucose 152 (*)     Fasting? Unknown      Calcium 10.7 (*)     AST 46 (*)     ALT 40      Alkaline  phosphatase 111      Protein, total 8.4      Albumin 5.1 (*)     Bilirubin, total 0.5     LACTIC ACID - Abnormal    Lactic acid 21.8 (*)    CBC WITH DIFFERENTIAL - Abnormal    WBC 15.4 (*)     RBC 5.85      Hemoglobin 16.8      Hematocrit 52.9      MCV 90.4      MCH 28.7      MCHC 31.8      RDW-CV 12.2      RDW-SD 40.4      Platelets 499 (*)     MPV 9.8      NRBC % 0.0      NRBC Absolute 0.00     BASIC METABOLIC PANEL - Abnormal    Sodium 139      Potassium 3.5 (*)     Chloride 105      CO2 (Bicarbonate) 23      Anion Gap 11      BUN 14      Creatinine 1.05      eGFRcr 98      Glucose 128      Fasting? Unknown      Calcium 9.2     MANUAL DIFFERENTIAL - SYSMEX WAM - Abnormal    Neutrophils % 40      Lymphocytes % 50      Monocytes % 6      Eosinophils % 0      Basophils % 1      Reactive Lymphocytes % 3      Neutrophils Absolute 6.17      Lymphocytes Absolute 7.71 (*)     Monocytes Absolute 0.93 (*)     Eosinophils Absolute 0.00      Basophils Absolute 0.15      Reactive Lymphs Absolute 0.46 (*)     Total WBC Counted 115      RBC Morphology See Indices (*)    TROPONIN T, HIGH SENSITIVITY - Normal    Troponin T, High Sensitivity 6     LACTIC ACID - Normal  Lactic acid 1.4     POCT GLUCOSE - Normal    POCT Glucose 133     CBC W/DIFF    Narrative:     The following orders were created for panel order CBC and differential.  Procedure                               Abnormality         Status                     ---------                               -----------         ------                     CBC w/ Differential[[id number]]          Abnormal            Final result               Manual Differential (LAB.Marland KitchenMarland Kitchen[[id number]]  Abnormal            Final result                 Please view results for these tests on the individual orders.      MEDICAL DECISION MAKING & ED COURSE  Pertinent labs and imaging studies reviewed.  Medical Decision Making  Breakthrough seizure (CMS-HCC) (HHS-HCC): acute illness or injury  Amount and/or  Complexity of Data Reviewed  Independent Historian: spouse     Details: colleagues  External Data Reviewed: notes.     Details: Neurology Sept 2023  Labs: ordered. Decision-making details documented in ED Course.  Discussion of management or test interpretation with external provider(s): Neurology    Risk  Prescription drug management.        Diagnoses as of 08/07/22 1117   Breakthrough seizure (CMS-HCC) (HHS-HCC)     Patient presented to ED after witnessed generalized tonic clonic seizure. On initial exam, patient is alert but somewhat fatigued, appears postictal. No respiratory distress, lungs clear. CV exam regular rhythm, mild tachycardia. Abdomen soft, benign. Skin warm, no rashes. Neck supple. Neuro exam nonfocal. Oropharynx reveals MMM, tongue laceration on right side of tongue, small and hemostatic.  Clinical findings are concerning for generalized seizure witnessed by co-residents likely in the setting of sleep deprivation and missed dose of medication. No fever or meningismus to suggest meningitis or other infectious etiology/trigger for seizure. No external signs of trauma or report of injury.   Neurology responded to Code Blue/ED and evaluated patient in ED, agreed with plan to give IV lacosamide, no indication for emergent neuroimaging.  Initial labs showed low bicarb 10, lactate 21 and mild leukocytosis WBC 15 and elevated Cr 1.27.  Patient treated in ED with lacosamide and IVFs.  Repeat labs showed improvement: Cr 1.05, lactate 1.4, bicarb 23.   Patient monitored in ED and continued to improve to baseline. Neurology offered hospitalization, but patient feels comfortable with discharge home and advised on importance of ongoing rest and oral hydration. He does have medications at home and has Neurologist (Dr Shon Hale at [facility]) to follow up with.  Spouse arrived in ED, supportive at bedside, will assist patient home.  Patient appeared much improved and able to tolerate PO fluids.  Patient counseled  on  clinical findings, concerns, and agrees with care plan. All questions were answered and all concerns were addressed to the best of my ability. Patient advised on reasons to return to ED and need for follow-up with PCP/Neurology for repeat exam. Patient appreciative of care and discharged in stable condition.    CLINICAL IMPRESSION:  Acute generalized seizure, recurrent     Leshae Mcclay E. Torion Hulgan, MD  08/07/22 1137

## 2022-08-05 NOTE — Unmapped (Signed)
EMERGENCY DEPARTMENT ENCOUNTER    Physician Assistant Note    Date of service: 08/05/2022  8:15 AM    HISTORY OF PRESENT ILLNESS:    32 y.o. male with a history seizure disorder on Vampat 150mg  BID who presents with seizure. Per friend the PT was sitting at a computer when he was noted to slump toward and groan, friends lowered him to the ground, no HS, and he proceeded to have tonic clonic seizure for 3-4 minutes and is currently post ictal. Bite to right side of tongue.     INDEPENDENT HISTORIAN    History provided by:  PT, chart, bystanders  History limited by:  post ictal  Language interpreter used: no    PAST MEDICAL HISTORY / PROBLEM LIST    Past Medical History:   Diagnosis Date   . Seizure (CMS-HCC) (HHS-HCC)        There is no problem list on file for this patient.      PAST SURGICAL HISTORY    History reviewed. No pertinent surgical history.    MEDICATIONS     Discharge Medication List as of 08/05/2022 12:21 PM      CONTINUE these medications which have NOT CHANGED    Details   diazePAM (Valtoco) 10 mg/spray (0.1 mL) Administer 2 sprays (10 mg) into affected nostril(s) if needed (PRN prolonged seizure > 5 minutes)., Starting Tue 05/16/2021, Normal      !! lacosamide (Vimpat) 150 mg tablet tablet Take 1 tablet (150 mg) by mouth twice daily., Starting Tue 05/08/2022, Normal      phenytoin ER (Dilantin Extended) 100 mg capsule Starting Fri 06/18/2014, Historical Med       !! - Potential duplicate medications found. Please discuss with provider.           ALLERGIES    Allergies   Allergen Reactions   . Amoxicillin Itching       SOCIAL HISTORY    Social History     Tobacco Use   . Smoking status: Never   . Smokeless tobacco: Never   Substance Use Topics   . Alcohol use: Never     Social History     Substance and Sexual Activity   Drug Use Never       FAMILY HISTORY    No family history on file.    TRIAGE VITALS    ED Triage Vitals [08/05/22 0819]   Temp Pulse Resp BP   37.1 C (98.8 F) 119 18 (!) 143/60       SpO2 Temp Source Heart Rate Source Patient Position   95 % Oral Monitor Sitting      BP Location FiO2 (%)     Left arm --            REVIEW OF SYSTEMS  Constitutional: Neg for fever or chills.  Cardiac: Neg for chest pain.  Respiratory: Neg for cough, SOB.  Abdomen: Neg for abdominal pain, nausea, vomiting, diarrhea, constipation.  Neuro: Pos for seizure. Neg for numbness, tingling, headache, weakness.      PHYSICAL EXAM    ED Triage Vitals [08/05/22 0819]   Temp Pulse Resp BP   37.1 C (98.8 F) 119 18 (!) 143/60      SpO2 Temp Source Heart Rate Source Patient Position   95 % Oral Monitor Sitting      BP Location FiO2 (%)     Left arm --          General: Patient is awake, confused,  post-ictal.      Head: The head is normocephalic and atraumatic.      Eyes: EOMI, PERRL.    ENT: Patient's airway is intact, mucous membranes moist. Bite to R side of tongue.    Neck: The neck is supple, trachea midline.    Cardiac: Tachycardic S1S2, no LE edema, no murmurs/rubs/gallops.    Chest: No deformities. Equal chest rise and fall.    Respiratory: Respiratory effort is normal, no respiratory distress, lungs clear to auscultation bilaterally.    Abdomen: Appears flat, soft, NTTP diffusely.    Neurologic: Post-ictal, moves all extremities, EOMI, PERRL.    Psychiatric: The patient is calm and cooperative.      MEDICAL DECISION MAKING    Nursing notes reviewed and I agree.    ACUTE CONDITIONS  Initial differential diagnosis: seizure, breakthrough seizure    CHRONIC CONDITIONS  Seizure disorder    EXTERNAL RECORDS AND EXTERNAL DATA REVIEWED  Yes, neuro records    TESTS AND WORKUP CONSIDERED  Labs, imaging    TREATMENT CONSIDERED  anticonvulsants    INDEPENDENT INTERPRETATIONS    CARDIAC  Cardiac monitor and rhythm strip visualized and interpreted by me and shows: sinus tach    LABS      Labs Reviewed   COMPREHENSIVE METABOLIC PANEL - Abnormal       Result Value    Sodium 143      Potassium 3.4 (*)     Chloride 96 (*)     CO2  (Bicarbonate) 10 (*)     Anion Gap 37 (*)     BUN 16      Creatinine 1.27      eGFRcr 78      Glucose 152 (*)     Fasting? Unknown      Calcium 10.7 (*)     AST 46 (*)     ALT 40      Alkaline phosphatase 111      Protein, total 8.4      Albumin 5.1 (*)     Bilirubin, total 0.5     LACTIC ACID - Abnormal    Lactic acid 21.8 (*)    CBC WITH DIFFERENTIAL - Abnormal    WBC 15.4 (*)     RBC 5.85      Hemoglobin 16.8      Hematocrit 52.9      MCV 90.4      MCH 28.7      MCHC 31.8      RDW-CV 12.2      RDW-SD 40.4      Platelets 499 (*)     MPV 9.8      NRBC % 0.0      NRBC Absolute 0.00     BASIC METABOLIC PANEL - Abnormal    Sodium 139      Potassium 3.5 (*)     Chloride 105      CO2 (Bicarbonate) 23      Anion Gap 11      BUN 14      Creatinine 1.05      eGFRcr 98      Glucose 128      Fasting? Unknown      Calcium 9.2     MANUAL DIFFERENTIAL - SYSMEX WAM - Abnormal    Neutrophils % 40      Lymphocytes % 50      Monocytes % 6      Eosinophils % 0  Basophils % 1      Reactive Lymphocytes % 3      Neutrophils Absolute 6.17      Lymphocytes Absolute 7.71 (*)     Monocytes Absolute 0.93 (*)     Eosinophils Absolute 0.00      Basophils Absolute 0.15      Reactive Lymphs Absolute 0.46 (*)     Total WBC Counted 115      RBC Morphology See Indices (*)    TROPONIN T, HIGH SENSITIVITY - Normal    Troponin T, High Sensitivity 6     LACTIC ACID - Normal    Lactic acid 1.4     POCT GLUCOSE - Normal    POCT Glucose 133     CBC W/DIFF    Narrative:     The following orders were created for panel order CBC and differential.  Procedure                               Abnormality         Status                     ---------                               -----------         ------                     CBC w/ Differential[[id number]]          Abnormal            Final result               Manual Differential (LAB.Marland KitchenMarland Kitchen[[id number]]  Abnormal            Final result                 Please view results for these tests on the individual orders.         RADIOLOGY  If applicable I have visualized, examined, and interpreted any of the patient's images and I agree with the radiologists report: Yes    No orders to display       DISCUSSION OF MANAGEMENT OR TEST INTERPRETATION WITH OTHER PROVIDERS INCLUDING CONSULTS  neurology    HOSPITALIZATION CONSIDERED  yes    SOCIAL DETERMINATES OF HEALTH  No barriers    Medications   lacosamide (Vimpat) injection 150 mg (150 mg intravenous Given 08/05/22 0829)   sodium chloride 0.9 % bolus 1,000 mL (0 mL intravenous Stopped 08/05/22 0925)   sodium chloride 0.9 % bolus 1,000 mL (0 mL intravenous Stopped 08/05/22 1119)         MY WORKING CLINICAL IMPRESSION / DIAGNOSIS:  Diagnoses as of 08/05/22 1751   Breakthrough seizure (CMS-HCC) (HHS-HCC)       I have re-evaluated the patient: yes, returned to baseline status    Disposition and Recommendations: 32 year old male with history of seizure disorder on lacosamide who presented to the emergency department with breakthrough seizure, no fall as this was witnessed and his friends helped him to the ground where he sees for 4 to 5 minutes.  Patient was initially postictal and confused and tachycardic was given a dose of lacosamide 150 mg IV after confirmation with pharmacy and reviewing outpatient neurology records.  Repeat labs normalized and the patient  returned to his baseline mental status and reported that he had forgotten to take his evening dose of lacosamide the night before around 10 PM and also had some sleep deprivation which both likely contributed to today's seizure otherwise he has infrequent seizures and was seizure-free approved number of years.  Patient appeared well at the time of discharge with normal vitals.  I sent a prescription for lacosamide to the patient's pharmacy, he will follow-up with his neurologist and outpatient providers, and given strict ER return instructions.    Patient agreeable to plan.    Chart generated using dictation software, please be aware of  "sound-a-like" terms and please excuse any typos!     Oneta Rack [place], [place]  08/05/22 1756

## 2022-08-05 NOTE — Significant Event (Signed)
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 This was followed by a brief period of loss of consciousness ~2-3 minutes. No incontinence or apparent tongue bite. He was moved to the ground, placed in recovery position and a rapid response was called. He gradually regained consciousness but there was post ictal confusion. He was assessed by the MICU and CCU attendings, and was transported to the ED after he was deemed stable for transport.     _____________________  Wesley Blas, MD  [facility] PGY-2

## 2022-08-05 NOTE — Significant Event (Signed)
Clinical Resource Nurse Consult  Reason for Clinical Resource Nurse : (P) Rapid Response, Code Blue, Anesthesia STAT  See narrator for details

## 2022-08-05 NOTE — Discharge Instructions (Addendum)
You were seen in the emergency department for breakthrough seizure.  You were treated with IV lacosamide.  Your initial labs were abnormal however the repeat had returned to normal other than slightly low potassium, please ensure to eat a potassium rich foods.  We sent you a new prescription for take it as directed.  Contact the neurology department in order to schedule appoint for follow-up and immediately return to the ER for any newly concerning symptoms.    Stay out of work for the next 2 days and ensure adequate sleep.

## 2022-08-05 NOTE — ED Notes (Signed)
Neuro at bedside.     Patient medicated per MAR. A&Ox3. Remains tachycardic to 115-120. On continuous cardiac an pulse oximetry. Patient denies any physical complaints. States he feels as he is still in post-ictal state.      Gloriajean Dell, RN  08/05/22 [phone]

## 2022-08-05 NOTE — ED Notes (Signed)
Patient A&Ox3. Reports feel "back to baseline".      Gloriajean Dell, RN  08/05/22 [phone]

## 2022-08-05 NOTE — Consults (Signed)
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  On 2/18 at around 8:04 AM patient was finishing his notes when he suddenly turned to the right side with a groaning sound, eyes deviated to the right, then started with generalized tonic-clonic shaking witnessed by his co-resident.  The episode lasted for about 1 minute and resolved on its own.  He has right-sided tongue bite and no urinary or stool incontinence.  After coming to the ED, patient slowly woke up with improvement in his mental status and did remember missing his evening dose of medication on 2/17 due to a busy call.  Prior to today, his last breakthrough seizure was in September 2024 in the setting of medication change and prior to that he has been seizure-free for over 8 years.    Patient did not sleep at all overnight, and other than this he denies any alcohol or drug use, and no infectious signs or symptoms.    Review of Systems:  Pertinent items are noted in HPI.    History:  Past Medical History:   Diagnosis Date   . Seizure (CMS-HCC) (HHS-HCC)      History reviewed. No pertinent surgical history.  No family history on file.  @LAHEYSOCHX @    Allergies:  Amoxicillin    PTA Medications:  @PTAMEDSIP @  Current Medications:  @CMEDIPSIG @    Physical Exam:   Min/Max vitals over last 24 hours:  BP  Min: 106/70  Max: 143/60  Temp  Min: 37.1 C (98.8 F)  Max: 37.1 C (98.8 F)  Pulse  Min: 84  Max: 119  Resp  Min: 11  Max: 18  SpO2  Min: 94 %  Max: 97 %     Gen: Appears comfortable, NAD  Resp: Breathing comfortably, no tachypnea  Ext: Warm and  well-perfused    Neuro:  Mental Status: Awake and alert and oriented to self, place, time, but amnestic to situation. Fluent speech. Follows simple and multi-step commands without difficulty. Attention to examiner is maintained appropriately.    Cranial Nerves: Visual fields are full to confrontation without extinction. Extraocular movements intact. No facial droop. Hearing intact to conversation. Palate elevates symmetrically. Tongue protrudes midline, right-sided tongue bite. No dysarthria.    Motor: Muscle bulk and tone are normal. No extraneous movements.  No pronator drift.  Full strength in upper and lower extremities in tested actions: shoulder abduction, arm flexion/extension, wrist extension, finger extension, hip flexion, knee flexion/extension, foot dorsiflexion/plantarflexion.    Stance/Gait: Deferred.    Impression:  32 year old male with history of epilepsy who presents with breakthrough seizure in the setting of missed dose of medication and sleep deprivation.  Patient was postictal upon arrival to the ED, however slowly improving and was close to baseline by the time we were examining him.  There is no concern for ongoing subclinical seizures or nonconvulsive status epilepticus, and we have a clear reason for breakthrough seizure in someone with longstanding history of epilepsy.    Recommendations:  -Continue to observe in the ED until back to baseline  -Continue current  antiseizure medication regimen with lacosamide 150 mg twice daily  -No need for further neuroimaging at the moment    Seen and discussed with Dr. Lutricia Horsfall    Signed by:   Loel Lofty, MD  PGY-4 Neurology Resident  Pager [phone]    For urgent matters and questions, please page [phone] (Adult neurology inpatient resident) or [phone] (Adult neurology consult resident) or [facility] Adult [facility] Consult or [facility] Adult [facility] and not the resident's personal pager or Tiger Text as they may not be covering at the time.

## 2022-08-06 LAB — BLOOD BANK HOLD TUBE

## 2022-08-07 NOTE — Telephone Encounter (Signed)
Patient requesting call back from:  [phone]    In regards to: had seizure recently was seen in ED    Call back number is: [phone]

## 2022-08-07 NOTE — Telephone Encounter (Addendum)
Last seen in Sept. Follow up due in March.  There is no follow up scheduled at this time.    Seen in ED for breakthrough Seizure on 2/18 in setting of missed med dose and sleep deprivation.      Spoke to [place].  He is concerned as he has now had a couple seizures in the past few months.  Asking to have appt as soon as possible and asking if medication/doses should be re evaluated.    Let him know I would forward message to Dr Shon Hale. Will also request follow up appt.

## 2022-08-07 NOTE — Telephone Encounter (Signed)
Emailed [name] re appt.

## 2022-08-07 NOTE — Telephone Encounter (Signed)
I called the patient to schedule an annual Physical Exam(PE). The patient has been scheduled. Thank you Rahaf Carbonell- Population Health LCO-Team

## 2022-08-09 ENCOUNTER — Inpatient Hospital Stay: Admit: 2022-08-09 | Payer: BLUE CROSS/BLUE SHIELD | Primary: Internal Medicine

## 2022-08-09 ENCOUNTER — Ambulatory Visit: Admit: 2022-08-09 | Discharge: 2022-08-09 | Payer: BLUE CROSS/BLUE SHIELD | Primary: Internal Medicine

## 2022-08-09 DIAGNOSIS — R509 Fever, unspecified: Secondary | ICD-10-CM

## 2022-08-09 LAB — CBC W/DIFF
Baso Abs: 0.05 10*3/uL (ref 0.00–0.22)
Basos: 0.8 %
Eos Abs: 0.11 10*3/uL (ref 0.00–0.50)
Eos: 1.8 %
Hct: 47.8 % (ref 37.0–53.0)
Hgb: 16.2 g/dL (ref 13.0–17.5)
Immature Grans Abs: 0.02 10*3/uL (ref 0.00–0.10)
Immature Granulocytes: 0.3 %
Lymphs Abs: 1.33 10*3/uL (ref 0.70–4.00)
Lymphs: 21.3 %
MCH: 28.9 pg (ref 26.0–34.0)
MCHC: 33.9 g/dL (ref 31.0–37.0)
MCV: 85.2 fL (ref 80.0–100.0)
Monocytes: 13.8 %
MonocytesAbs: 0.86 10*3/uL — ABNORMAL HIGH (ref 0.38–0.83)
NRBC: 0 % (ref 0–0)
Neutrophils Abs: 3.88 10*3/uL (ref 1.50–7.95)
Neutrophils: 62 %
Platelets: 302 10*3/uL (ref 150–400)
RBC: 5.61 M/uL (ref 4.20–5.90)
RDW: 12 % (ref 11.5–14.5)
WBC: 6.3 10*3/uL (ref 4.0–11.0)

## 2022-08-09 LAB — COMPREHENSIVE METABOLIC PANEL
ALT: 39 IU/L (ref 0–55)
AST: 35 IU/L (ref 6–42)
Albumin: 4.5 g/dL (ref 3.2–5.0)
Alk Phosphatase: 100 U/L (ref 30–130)
Anion Gap: 9 mmol/L — ABNORMAL LOW (ref 10.0–18.0)
BUN: 9 mg/dL (ref 3–24)
Bili Total: 0.3 mg/dL (ref 0.2–1.2)
Calcium: 10 mg/dL (ref 8.5–10.5)
Carbon Dioxide: 29 mmol/L (ref 20–32)
Chloride: 103 mmol/L (ref 98–110)
Creat: 1.02 mg/dL (ref 0.55–1.30)
Glucose: 87 mg/dL (ref 70–99)
Potassium: 4.5 mmol/L (ref 3.6–5.2)
Protein Total: 7.2 g/dL (ref 6.0–8.4)
Sodium: 141 mmol/L (ref 135–146)
eGFR: 101 mL/min/{1.73_m2}

## 2022-08-09 NOTE — Progress Notes (Signed)
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 Patient developed a fever started Sunday night 2/18. Has had a fever daily since then, Tmax 101.62F (39.1C). Also endorsing productive cough yellow-white sputum, loss of appetite, congestion, headaches, and myalgias. Endorsing SOB with exertion. See self reported ROS below for more details. 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 Has been alternating taking tylenol and advil over the last few days with some improvement, but feels bad again when they wear off. Having difficulty sleeping as well. Tolerating PO okay. Denies abdominal or urinary symptoms.     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 Episode lasted about 1-2 minutes, and resolved on his own. Rapid response was called. He was taken to the ED, Neurology was consulted. He recalled that he did not take his evening dose of lacosamide on 2/17 given he had a busy call. His presentation was thought to be from a generalized tonic-clonic seizure given his history. He was post-ictal in the ED but slowly improved back to his baseline. Initial labs were notable for bicarbonate of 10, Cr 1.27, lactate 21.8, WBC 15.4. He was given 2L LR and vimpat 150 mg IV, and his labs  normalized. He was discharged home the same day 2/18.     Prior to this episode, his last seizure was 02/2022 in the setting of medication changes, and has since been on lacosamide 150 mg BID. Prior to September 2023, his last seizure was in 2015.       Answers for HPI/ROS submitted by the patient on 08/09/2022  Chronicity: new  Onset: in the past 7 days  Frequency: daily  Progression since onset: unchanged  Max temp prior to arrival: 101 to 101.9 F  Temperature source: an oral thermometer  abdominal pain: No  chest pain: No  congestion: Yes  cough: Yes  diarrhea: No  ear pain: No  headaches: Yes  muscle aches: Yes  nausea: No  rash: No  sleepiness: No  sore throat: No  urinary pain: No  vomiting: No  wheezing: No      Patient Active Problem List   Diagnosis   . Fever     Current Outpatient Medications   Medication Instructions   . lacosamide (VIMPAT) 150 mg, oral, 2 times daily   . lacosamide (VIMPAT) 150 mg, oral, 2 times daily   . phenytoin ER (Dilantin Extended) 100 mg capsule No dose, route, or frequency recorded.   . Valtoco 10 mg, nasal, As needed     Allergies   Allergen Reactions   . Amoxicillin Itching     Past Medical History:   Diagnosis Date   . Seizure (CMS-HCC) (HHS-HCC)      No  past surgical history on file.  No family history on file.  Social History     Tobacco Use   . Smoking status: Never   . Smokeless tobacco: Never   Substance Use Topics   . Alcohol use: Never   . Drug use: Never       Objective   Visit Vitals  BP 127/74 (BP Location: Left arm, Patient Position: Sitting, BP Cuff Size: Adult)   Pulse 111   Temp 36.7 C (98.1 F) (Oral)   Resp 18   Ht 1.88 m   Wt 87.5 kg   SpO2 99%   BMI 24.78 kg/m   BSA 2.14 m       Physical Exam  Constitutional:       General: He is not in acute distress.     Appearance: Normal appearance.   HENT:      Head: Normocephalic and atraumatic.   Eyes:      Extraocular Movements: Extraocular movements intact.      Conjunctiva/sclera: Conjunctivae normal.    Cardiovascular:      Rate and Rhythm: Regular rhythm. Tachycardia present.      Heart sounds: No murmur heard.  Pulmonary:      Effort: Pulmonary effort is normal. No respiratory distress.      Breath sounds: Normal breath sounds. No wheezing, rhonchi or rales.   Musculoskeletal:         General: Normal range of motion.      Cervical back: Normal range of motion and neck supple.   Neurological:      General: No focal deficit present.      Mental Status: He is alert.   Psychiatric:         Mood and Affect: Mood normal.         Behavior: Behavior normal.         Thought Content: Thought content normal.         Judgment: Judgment normal.         Assessment/Plan   [name] was seen today for fever, cough, shortness of breath and chills.  Fever, unspecified fever cause  Assessment & Plan:  Presenting with fever starting on 2/18 after witnessed seizure event. Associated productive cough with yellow-white sputum, myalgias, dyspnea on exertion, nausea, and poor appetite. Recent exposure to both COVID and Flu positive patients in the ICU. Suspect most likely viral URI/pneumonia given recent exposures and nature of symptoms in an otherwise healthy 32 year old, however given timing with recent seizure, would be concerned about aspiration pneumonia. Will evaluate for consolidation with CXR, check COVID/Flu, and CMP/CBC as well. If CXR shows consolidation and COVID/Flu negative, would treat with 5 day course of Moxifloxacin for aspiration pneumonia. Recommended continued supportive management with hydration, advil/tylenol PRN, and rest.   Orders:  -     XR CHEST 2 VIEWS; Future  -     CBC and differential - WAM and non-WAM; Future  -     Comprehensive metabolic panel; Future  -     SARS CoV2 (COVID19) & Influenza A + B, NAAT        Patient discussed with Dr. Elwyn Reach.       Wallis Mart, MD  Internal Medicine PGY-2  [facility]

## 2022-08-09 NOTE — Assessment & Plan Note (Signed)
Presenting with fever starting on 2/18 after witnessed seizure event. Associated productive cough with yellow-white sputum, myalgias, dyspnea on exertion, nausea, and poor appetite. Recent exposure to both COVID and Flu positive patients in the ICU. Suspect most likely viral URI/pneumonia given recent exposures and nature of symptoms in an otherwise healthy 32 year old, however given timing with recent seizure, would be concerned about aspiration pneumonia. Will evaluate for consolidation with CXR, check COVID/Flu, and CMP/CBC as well. If CXR shows consolidation and COVID/Flu negative, would treat with 5 day course of Moxifloxacin for aspiration pneumonia. Recommended continued supportive management with hydration, advil/tylenol PRN, and rest.

## 2022-08-10 LAB — SARS COV2 (COVID19) AND INFLUENZA A AND B, NAAT (QUEST,LABCORP)
Influenza A, NAA: DETECTED — AB
Influenza B, NAA: NOT DETECTED
SARS-CoV-2, NAA: NOT DETECTED

## 2022-08-14 ENCOUNTER — Telehealth: Admit: 2022-08-14 | Payer: BLUE CROSS/BLUE SHIELD | Attending: Neurology | Primary: Internal Medicine

## 2022-08-14 DIAGNOSIS — R569 Unspecified convulsions: Secondary | ICD-10-CM

## 2022-08-14 MED ORDER — lacosamide (Vimpat) 100 mg tablet
100 | ORAL_TABLET | ORAL | 5 refills | Status: AC
Start: 2022-08-14 — End: ?
  Filled 2022-08-15: qty 120, 30d supply, fill #0

## 2022-08-14 NOTE — H&P (Signed)
Patient returns/ TL Audio only    This real-time, interactive virtual Telehealth encounter was done by phone with the patient's verbal consent. Two patient identifiers were used and confirmed. Physical location of the patient: home Patient resides in: [place]  Physical location of the provider: office. 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  On 2/18 at around 8:04 AM patient was finishing his notes when he suddenly turned to the right side with a groaning sound, eyes deviated to the right, then started with generalized tonic-clonic shaking witnessed by his co-resident.  The episode lasted for about 1 minute and resolved on its own.  He has right-sided tongue bite and no urinary or stool incontinence.  After coming to the ED, patient slowly woke up with improvement in his mental status and did remember missing his evening dose of medication on 2/17 due to a busy call.  Prior to today, his last breakthrough seizure was in September 2024 in the setting of medication change and prior to that he has been seizure-free for over 8 years.    Patient did not sleep at all overnight, and other than this he denies any alcohol or drug use, and no infectious signs or symptoms.    Review of Systems:  Pertinent items are noted in HPI.    History:  Medical History        Past Medical History:   Diagnosis Date   . Seizure (CMS-HCC) (HHS-HCC)         Surgical History   History reviewed. No pertinent surgical history.     Family History   No family history on file.     @LAHEYSOCHX @    Allergies:  Amoxicillin    PTA Medications:  @PTAMEDSIP @  Current Medications:  @CMEDIPSIG @    Physical Exam:   Min/Max vitals  over last 24 hours:  BP  Min: 106/70  Max: 143/60  Temp  Min: 37.1 C (98.8 F)  Max: 37.1 C (98.8 F)  Pulse  Min: 84  Max: 119  Resp  Min: 11  Max: 18  SpO2  Min: 94 %  Max: 97 %     Gen: Appears comfortable, NAD  Resp: Breathing comfortably, no tachypnea  Ext: Warm and well-perfused    Neuro:  Mental Status: Awake and alert and oriented to self, place, time, but amnestic to situation. Fluent speech. Follows simple and multi-step commands without difficulty. Attention to examiner is maintained appropriately.    Cranial Nerves: Visual fields are full to confrontation without extinction. Extraocular movements intact. No facial droop. Hearing intact to conversation. Palate elevates symmetrically. Tongue protrudes midline, right-sided tongue bite. No dysarthria.    Motor: Muscle bulk and tone are normal. No extraneous movements.  No pronator drift.  Full strength in upper and lower extremities in tested actions: shoulder abduction, arm flexion/extension, wrist extension, finger extension, hip flexion, knee flexion/extension, foot dorsiflexion/plantarflexion.    Stance/Gait: Deferred.    Impression:  32 year old male with history of epilepsy who presents with breakthrough seizure in the setting of missed dose of medication and sleep deprivation.  Patient was postictal upon arrival to the ED, however slowly improving and was close to baseline  by the time we were examining him.  There is no concern for ongoing subclinical seizures or nonconvulsive status epilepticus, and we have a clear reason for breakthrough seizure in someone with longstanding history of epilepsy.    Recommendations:  -Continue to observe in the ED until back to baseline  -Continue current antiseizure medication regimen with lacosamide 150 mg twice daily  -No need for further neuroimaging at the moment    Seen and discussed with Dr. Lutricia Horsfall    Signed by:   Loel Lofty, MD  PGY-4 Neurology Resident  Pager [phone]    For urgent matters and  questions, please page [phone] (Adult neurology inpatient resident) or [phone] (Adult neurology consult resident) or [facility] Adult [facility] or [facility] Adult [facility] and not the resident's personal pager or Tiger Text as they may not be covering at the time.            Cosigned by: Lequita Halt, MD at 08/05/2022 9:37 PM     He verifies the story, also still has numbness, Vimpat 150 BID was working well for months despite the missing 1 dose/ sleep deprivation    We discussed in detail- will go titrate to 200 BID for now over next weeks while travelling, seizure/safety/RMV precautions, all questions answered, still has numbness, will order EMG, RV, Blanca Friend MD

## 2022-09-13 MED FILL — LACOSAMIDE 100 MG TABLET: 100 100 mg | ORAL | 30 days supply | Qty: 120 | Fill #1 | Status: CP

## 2022-10-10 MED FILL — LACOSAMIDE 100 MG TABLET: 100 100 mg | ORAL | 30 days supply | Qty: 120 | Fill #2 | Status: CP

## 2022-10-23 ENCOUNTER — Ambulatory Visit: Payer: BLUE CROSS/BLUE SHIELD | Attending: Internal Medicine | Primary: Internal Medicine

## 2022-11-15 MED FILL — LACOSAMIDE 100 MG TABLET: 100 100 mg | ORAL | 30 days supply | Qty: 120 | Fill #3 | Status: CP

## 2022-12-03 ENCOUNTER — Telehealth: Payer: BLUE CROSS/BLUE SHIELD | Attending: Neurology | Primary: Internal Medicine

## 2022-12-03 MED ORDER — phenytoin ER (Dilantin KapseaL) 100 mg capsule
100 | ORAL_CAPSULE | ORAL | 11 refills | 43.00000 days | Status: AC
Start: 2022-12-03 — End: ?
  Filled 2022-12-04: qty 120, 30d supply, fill #0

## 2022-12-03 NOTE — H&P (Signed)
This real-time, interactive virtual Telehealth encounter was done by video with the patient's verbal consent. Two patient identifiers were used and confirmed. Physical location of the patient: home Patient resides in: [place]  Physical location of the provider: office. Other participants/involvement: none  Total minutes spent: 25 with coordination of care    I / we did a TL telemedicine encounter as patient is having breakthru seizures on Vimpat 400 mg/day    Wants to proceed with EMG/ go back to phenytoin for now, gave him a regimen for this, and then consider other options will send him a list    Otherwise appears baseline on video    A/P : Seizure disorder, precipitated by poor sleep- considering further evaluation if needed but will proceed as above, back on dilantin 400/day, EMG to discern if has neuropathy from any other cause other than dilantin, consider options, consider sleep evaluation, seizure/safety precautions, RV [name] MD

## 2022-12-03 NOTE — Telephone Encounter (Signed)
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

## 2022-12-04 NOTE — Telephone Encounter (Signed)
Called pt back and booked for 7/17.

## 2022-12-14 LAB — BASIC METABOLIC PANEL
Anion Gap: 15 mmol/L (ref 10.0–18.0)
BUN/Creat Ratio: 16 (ref 9–20)
BUN: 15 mg/dL (ref 6–20)
Calcium: 10.1 mg/dL (ref 8.7–10.2)
Carbon Dioxide: 24 mmol/L (ref 20–29)
Chloride: 103 mmol/L (ref 96–106)
Creat: 0.96 mg/dL (ref 0.76–1.27)
Glucose: 89 mg/dL (ref 70–99)
Potassium: 4.6 mmol/L (ref 3.5–5.2)
Sodium: 142 mmol/L (ref 134–144)
eGFR: 109 mL/min/{1.73_m2} (ref >59)

## 2022-12-14 LAB — PHENYTOIN LEVEL, TOTAL: Phenytoin Lvl: 10.6 ug/mL (ref 10.0–20.0)

## 2022-12-18 MED FILL — LACOSAMIDE 100 MG TABLET: 100 100 mg | ORAL | 30 days supply | Qty: 120 | Fill #4 | Status: CP

## 2023-01-02 ENCOUNTER — Inpatient Hospital Stay: Admit: 2023-01-02 | Discharge: 2023-01-02 | Payer: BLUE CROSS/BLUE SHIELD | Primary: Internal Medicine

## 2023-01-02 DIAGNOSIS — R202 Paresthesia of skin: Secondary | ICD-10-CM

## 2023-01-02 DIAGNOSIS — R200 Anesthesia of skin: Secondary | ICD-10-CM

## 2023-01-06 MED FILL — PHENYTOIN SODIUM EXTENDED 100 MG CAPSULE: 100 100 mg | ORAL | 30 days supply | Qty: 120 | Fill #1 | Status: CP

## 2023-01-07 ENCOUNTER — Ambulatory Visit: Payer: BLUE CROSS/BLUE SHIELD | Attending: Internal Medicine | Primary: Internal Medicine

## 2023-01-25 ENCOUNTER — Ambulatory Visit
Admit: 2023-01-25 | Discharge: 2023-01-25 | Payer: BLUE CROSS/BLUE SHIELD | Attending: Physician Assistant | Primary: Internal Medicine

## 2023-01-25 DIAGNOSIS — Z0000 Encounter for general adult medical examination without abnormal findings: Secondary | ICD-10-CM

## 2023-01-25 NOTE — Progress Notes (Incomplete)
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 No food restrictions   Exercise: Walks dog daily; does peloton workouts sometimes   Sleep: 6-8 hours; reports loud snoring and sometimes not feeling refreshed upon waking.  Safety/Partner Violence -lives with his wife now. No safety concerns   Mood - good   EOTH: rarely  Smoking: never   Drug: no     Review of Systems:   Pertinent positives and negatives as per HPI, otherwise a 13 point review of systems was negative.     PAST MEDICAL HISTORY:     Past Medical History:   Diagnosis Date   . Seizure (Multi-HCC)      Patient Active Problem List   Diagnosis   (none) - all problems resolved or deleted       PAST SURGICAL HISTORY:   No past surgical history on file.     HOME MEDICATIONS:     Current Outpatient Medications   Medication Instructions   . phenytoin ER (Dilantin KapseaL) 100 mg capsule Take 2 capsules by mouth 2 times daily   . Valtoco 10 mg, nasal, As needed        ALLERGIES:     Allergies   Allergen Reactions   . Amoxicillin Itching        FAMILY  HISTORY:     Family History   Problem Relation Name Age of Onset   . Other (HTN) Father         SOCIAL HISTORY:     Social History     Social History Narrative   . Not on file       VITALS AND PHYSICAL EXAM:   Vitals:   Vitals:    01/25/23 0758   BP: 119/72   Pulse: 72   Resp: 18   Temp: 36.3 C (97.4 F)   TempSrc: Oral   SpO2: 97%   Weight: 88.9 kg   Height: 1.88 m        Physical Exam     Physical Examination:  General: NAD, pleasant  Skin: Normal turgor, normal color, no rashes  Ears: no external deformities, gross hearing intact, TM pearly grey   Eyes:  PERRLA, EOMI    Mouth: MMM, OP clear without erythema or exudate  Neck: supple, no masses, thyroid normal size, no thyroid tenderness   LN: no cervical/supraclavicular/submandibular/submental/post auricular LAD  Cardiovascular: RRR, no M/R/G  Respiratory: CTA bilaterally, breathing even and unlabored  Abdomen: good bowel sounds, soft, nontender, nondistended, no HSM   Back: no spinal/paraspinal muscle tenderness  Extremities: WWP, no edema  Musculoskeletal: normal gait  Neuro: non foal  Psych: AAO x 3    LABS:     Lab Results   Component Value Date    WBC 6.3 08/09/2022    HGB 16.2 08/09/2022    HCT 47.8 08/09/2022    PLT 302 08/09/2022    ALT 39 08/09/2022    AST 35 08/09/2022    ALKPHOS 100 08/09/2022    BILITOT 0.3 08/09/2022  GLUCOSE 89 12/13/2022    CALCIUM 10.1 12/13/2022    NA 142 12/13/2022    K 4.6 12/13/2022    CL 103 12/13/2022    CREATININE 0.96 12/13/2022    BUN 15 12/13/2022    EGFR 109 12/13/2022    CO2 24 12/13/2022    HGBA1C 4.9 04/21/2021        ASSESSMENT & PLAN:   Annual physical exam  -     CBC and differential; Future  -     Basic metabolic panel; Future  -     Hemoglobin A1c; Future  -     TSH with reflex; Future  -     Lipid panel; Future  -     Hepatitis C antibody; Future  -     CBC and differential  -     Basic metabolic panel  -     Hemoglobin A1c  -     TSH with reflex  -     Lipid panel  -     Hepatitis C antibody  Nonintractable epilepsy without status epilepticus, unspecified epilepsy type (Multi-HCC)       Routine Healthcare Maintenance  Cardiovascular     - ASCVD: ASCVD Risk is N/A%.      - A1c:    Lab Results   Component Value Date/Time    HGBA1C 4.9 04/21/2021 1456        - HTN:   BP Readings from Last 3 Encounters:   01/25/23 119/72   08/09/22 127/74   08/05/22 106/70        - HLD:  No results found for: "TRIG", "CHOL", "HDL", "LDLCALC", "CHOLHDLRATIO"   Malignancy Screenings  - Colon Cancer(q10y at 45,FIT q1y; Cologuard q3y): N/A  - Skin Cancer: Discussed wearing sunscreen.     - Prostate  Cancer: PSA (men 50 avg risk, 40-45 high risk q2y): N/A   ID/Sexual Health     - Chlamydia/gonorrhea:  No concerns; declined STD screening      - Syphilis/Anal Pap (q1y MSM): Patient declines or refuses  - HIV: Patient declines or refusesNo results found for: "HIV1X2"  - HCV: Ordered todayNo results found for: "HEPCAB"  Other/Psych/SUD/Social RF  - Anxiety: GAD-2: Over the last 2 weeks, how often have you been bothered by the following problems?  Feeling Nervous, Anxious, or on Edge: Not at all  Not Being Able to Stop or Control Worrying: Not at all  Worrying too Much About Different Things: Several days  Trouble Relaxing: Several days  Being so Restless That it is Hard to Sit Still: Not at all  Becoming Easily Annoyed or Irritable: Several days  Feeling Afraid as if Something Awful Might Happen: Not at all  GAD-7 Total Score: 3       - Depression: PHQ-2:                  - Hearing: (q2h > 50; hearing aid q2y): No concerns  - Vision: No concerns or changes in vision   - Dentist: Overdue for dental cleaning   Immunizations  Will send his vaccine records through portal        - Influenza:  UTD; last 04/18/2022      - COVID: UTD per pt      - TDaP/Teatnus:  UTD  per pt     - HPV:  co      - HAV:  UTD      - HBV :  UTD  Snoring heavily  Not feeling refreshed upon   Thinks he might have sleep apnea   He will discuss with his neurology to see if it is better to do HST or inpatient sleep study     Preventive medicine guidelines explained and anticipatory guidance given to the patient.   Questions/concerns or new/worsening sx to contact the office.  Return to clinic in 1 year for annual exam.  ____________________________________  Alm Bustard, PA   01/25/23, 11:58 PM

## 2023-01-25 NOTE — Progress Notes (Signed)
[facility] [place]   [address]  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 No food restrictions   Exercise: Walks dog daily; does peloton workouts sometimes   Sleep: 6-8 hours; reports loud snoring and sometimes not feeling refreshed upon waking  Safety/Partner Violence -lives with his wife. No safety concerns   Mood - good   EOTH: rarely  Smoking: never   Drug use: no     Review of Systems:   Pertinent positives and negatives as per HPI, otherwise a 13 point review of systems was negative.     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     HOME MEDICATIONS:     Current Outpatient Medications   Medication Instructions    phenytoin ER (Dilantin KapseaL) 100 mg capsule Take 2 capsules by mouth 2 times daily    Valtoco 10 mg, nasal, As needed        ALLERGIES:     Allergies   Allergen Reactions    Amoxicillin Itching        FAMILY  HISTORY:     Family History   Problem Relation Name Age of Onset    Other (HTN) Father         SOCIAL HISTORY:     Social History     Social History Narrative    Not on file       VITALS AND PHYSICAL EXAM:   Vitals:   Vitals:    01/25/23 0758   BP: 119/72   Pulse: 72   Resp: 18   Temp: 36.3 C (97.4 F)   TempSrc: Oral   SpO2: 97%   Weight: 88.9 kg   Height: 1.88 m        Physical Exam     Physical Examination:  General: NAD, pleasant  Skin: Normal turgor, normal color, no rashes  Ears: no external deformities, gross hearing intact, TM pearly grey   Eyes:   PERRLA, EOMI   Mouth: MMM, OP clear without erythema or exudate  Neck: supple, no masses, thyroid normal size, no thyroid tenderness   LN: no cervical/supraclavicular/submandibular/submental/post auricular LAD  Cardiovascular: RRR, no M/R/G  Respiratory: CTA bilaterally, breathing even and unlabored  Abdomen: good bowel sounds, soft, nontender, nondistended, no HSM   Back: no spinal/paraspinal muscle tenderness  Extremities: WWP, no edema  Musculoskeletal: normal gait  Neuro: non foal. Diminished sensation to light touch bilaterally on the balls of the feet. Strength intact   Psych: AAO x 3    LABS:     Lab Results   Component Value Date    WBC 6.3 08/09/2022    HGB 16.2 08/09/2022    HCT 47.8 08/09/2022    PLT 302 08/09/2022    ALT 39 08/09/2022    AST 35  08/09/2022    ALKPHOS 100 08/09/2022    BILITOT 0.3 08/09/2022    GLUCOSE 89 12/13/2022    CALCIUM 10.1 12/13/2022    NA 142 12/13/2022    K 4.6 12/13/2022    CL 103 12/13/2022    CREATININE 0.96 12/13/2022    BUN 15 12/13/2022    EGFR 109 12/13/2022    CO2 24 12/13/2022    HGBA1C 4.9 04/21/2021        ASSESSMENT & PLAN:   Annual physical exam  -     CBC and differential; Future  -     Basic metabolic panel; Future  -     Hemoglobin A1c; Future  -     TSH with reflex; Future  -     Lipid panel; Future  -     Hepatitis C antibody; Future  -     CBC and differential  -     Basic metabolic panel  -     Hemoglobin A1c  -     TSH with reflex  -     Lipid panel  -     Hepatitis C antibody  Nonintractable epilepsy without status epilepticus, unspecified epilepsy type (Multi-HCC)  -follows with neurology, Dr. 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      - A1c:    Lab Results   Component Value Date/Time    HGBA1C 4.9 04/21/2021 1456        - HTN:   BP Readings from Last 3 Encounters:   01/25/23 119/72   08/09/22 127/74   08/05/22 106/70        - HLD:  No results found for: "TRIG", "CHOL", "HDL", "LDLCALC", "CHOLHDLRATIO"   Malignancy Screenings  - Colon Cancer(q10y at 45,FIT q1y; Cologuard q3y): N/A  - Skin Cancer: Discussed wearing sunscreen.     - Prostate Cancer: PSA (men 50 avg risk, 40-45 high risk q2y): N/A   ID/Sexual Health     - Chlamydia/gonorrhea:  No concerns; declined STD screening      - Syphilis/Anal Pap (q1y MSM): Patient declines or refuses  - HIV: Patient declines or refusesNo results found for: "HIV1X2"  - HCV: Ordered todayNo results found for: "HEPCAB"  Other/Psych/SUD/Social RF  - Anxiety: GAD-2: Over the last 2 weeks, how often have you been bothered by the following problems?  Feeling Nervous, Anxious, or on Edge: Not at all  Not Being Able to Stop or Control Worrying: Not at all  Worrying too Much About Different Things: Several days  Trouble Relaxing: Several days  Being so Restless  That it is Hard to Sit Still: Not at all  Becoming Easily Annoyed or Irritable: Several days  Feeling Afraid as if Something Awful Might Happen: Not at all  GAD-7 Total Score: 3       - Depression: PHQ-2:                  - Hearing: (q2h > 50; hearing aid q2y): No concerns  - Vision: No concerns or changes in vision   - Dentist: Overdue for dental cleaning    Immunizations  Will send his vaccine records through portal        - Influenza:  UTD; last 04/18/2022      - COVID: UTD per pt      - TDaP/Teatnus:  UTD  per pt     - HPV:  completed per pt       - HAV:  completed per pt       - HBV:  completed per pt     Preventive medicine guidelines explained and anticipatory guidance given to the patient.   Questions/concerns or new/worsening sx to contact the office.  Return to clinic in 1 year for annual exam.  ____________________________________  Alm Bustard, PA

## 2023-01-29 ENCOUNTER — Ambulatory Visit
Admit: 2023-01-29 | Discharge: 2023-01-29 | Payer: BLUE CROSS/BLUE SHIELD | Attending: Neurology | Primary: Internal Medicine

## 2023-01-29 DIAGNOSIS — R200 Anesthesia of skin: Secondary | ICD-10-CM

## 2023-01-29 DIAGNOSIS — R202 Paresthesia of skin: Secondary | ICD-10-CM

## 2023-01-29 NOTE — H&P (Signed)
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  Additionally having been on Dilantin for approximately a 10-year.  He is worried about having a Dilantin neuropathy which could manifest as an axonal neuropathy, he has some painful numbness in the soles of his feet.  He had a extensive EMG including screen for tarsal tunnel syndrome as well as plantar nerves which were robustly normal as are his sensory nerve action potentials in the lower extremities, these are not even borderline they are squarely within normal limits.  The patient is doing well although he has his baseline sensory complaint.  The patient is doing well in general    Course of appointment: I reviewed his case with the neuromuscular team.  They would like to obtain further laboratory serologies which I have ordered, if all is negative then he can have an evaluation for small fiber neuropathy, we cannot fully exclude an independent process other than the Dilantin contributing to this time as there is no definitely pathognomonic signature features from any testing including EMG which is robustly normal. 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  Neuromuscular team wanted to make sure that we had C-spine neuroimaging which we did which does not explain the symptoms.     Past Medical History  He has a past medical history of Seizure (Multi-HCC).    Surgical History  He has no past surgical history on file.      Social History  He reports that he has never smoked. He has never used smokeless tobacco. He reports that he does not drink alcohol and does not use drugs.    Family History  Family History   Problem Relation Name Age of Onset    Other (HTN) Father          Advance Care Plan  Extended Emergency Contact Information  Primary Emergency Contact: Loud[place]lager,[place]  Mobile Phone: [phone]  Relation: Spouse  Interpreter needed? No  No Order           Allergies  Amoxicillin     Medications    Current Outpatient Medications:     diazePAM (Valtoco) 10 mg/spray (0.1 mL), Administer 2 sprays (10 mg) into affected nostril(s) if needed (PRN prolonged seizure > 5 minutes)., Disp: 2 each, Rfl: 1    phenytoin ER (Dilantin KapseaL) 100 mg capsule, Take 2 capsules by mouth 2 times daily, Disp: 120 capsule, Rfl: 11    Medications       None            Review of Systems  As above  Objective   Physical Exam  Patient is alert, attentive, and oriented. Speech is coherent and fluent without dysarthria or aphasia. Comprehension and ability to follow commands were intact          Cranial Nerves: Pupils were round and reacted to light and accommodation. Visual fields were intact. Extraocular movements were  full and conjugate There was no diplopia or ptosis. Facial sensation intact. There was no nystagmus. There was no face, jaw, palate or tongue weakness or atrophy. Palate raises symmetrically. Hearing was grossly intact to conversation. Shoulder shrug and head turn was normal.          Motor Exam: revealed normal muscle bulk and tone. There is no evidence of atrophy or fasciculations. Motor seems functionally 5/5. Finger-nose- finger, heel to shin and rapid alternating movements were intact.          Coordination: grossly normal          Sensory: exam not fully assessed but LT grossly intact          Gait: Patient had normal gait     Heart S1 S2 Lungs CTA  Extr no CCE    Last Recorded Vitals  Blood pressure 119/74, pulse 80, height 1.88  m, weight 89.8 kg, SpO2 96%.    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    Plan: Serologies as above got consent for HIV, oral consent, reassess after, return visit, seizure safety RV precautions, all questions answered it was a pleasure evaluating today    Lewis Moccasin, MD

## 2023-01-30 LAB — IRON, FERRITIN, TIBC
Ferritin: 79 ng/mL (ref 30–400)
Iron Bind.Cap.(TIBC): 283 ug/dL (ref 250–450)
Iron Saturation: 48 % (ref 15–55)
Iron: 136 ug/dL (ref 38–169)
UIBC: 147 ug/dL (ref 111–343)

## 2023-01-30 LAB — HEPATIC FUNCTION PANEL
ALT: 52 IU/L — ABNORMAL HIGH (ref 0–44)
AST: 36 IU/L (ref 0–40)
Albumin: 4.6 g/dL (ref 4.3–5.2)
Alk Phosphatase: 116 IU/L (ref 44–121)
Bili Total: 0.3 mg/dL (ref 0.0–1.2)
Bilirubin, Direct: <0.1 mg/dL (ref 0.00–0.40)

## 2023-01-30 LAB — C-REACTIVE PROTEIN: C-Reactive Protein Quant: 2 mg/L (ref 0–10)

## 2023-01-30 LAB — SJOGREN'S AB, ANTI-SS-A/-SS-B
SSA Ab: <0.2 AI (ref 0.0–0.9)
SSB Ab: <0.2 AI (ref 0.0–0.9)

## 2023-01-30 LAB — PHENYTOIN LEVEL, TOTAL: Phenytoin Lvl: 11.4 ug/mL (ref 10.0–20.0)

## 2023-01-30 LAB — HIV AB/AG SCREEN: HIV Scr 4th Gen: NONREACTIVE

## 2023-02-01 LAB — PROTEIN ELECTROPHORESIS, SERUM
A/G Ratio: 1.8 — ABNORMAL HIGH (ref 0.7–1.7)
Albumin: 4.4 g/dL (ref 2.9–4.4)
Alpha1 Glob: 0.2 g/dL (ref 0.0–0.4)
Alpha2 Glob: 0.7 g/dL (ref 0.4–1.0)
Beta Glob: 0.9 g/dL (ref 0.7–1.3)
Gamma Glob: 0.7 g/dL (ref 0.4–1.8)
Glob Total: 2.5 g/dL (ref 2.2–3.9)
Protein Total: 6.9 g/dL (ref 6.0–8.5)

## 2023-02-01 NOTE — Telephone Encounter (Signed)
-----   Message from Lewis Moccasin, MD sent at 02/01/2023  9:28 AM EDT -----  Regarding: testing  Can you pls counsel Dr Sima Matas that his HIV results are negative/ thx Alvino Chapel

## 2023-02-01 NOTE — Telephone Encounter (Signed)
Informed Dr Myrle Sheng that HIV test was negative.

## 2023-02-05 LAB — CRYOGLOBULIN

## 2023-02-06 MED FILL — PHENYTOIN SODIUM EXTENDED 100 MG CAPSULE: 100 100 mg | ORAL | 30 days supply | Qty: 120 | Fill #2 | Status: CP

## 2023-02-09 LAB — VITAMIN B6, PLASMA: Vitamin B6 Lvl: 10.9 ug/L (ref 3.4–65.2)

## 2023-02-10 LAB — ANA 12 PROFILE, POSITIVE ANA (REFLEX ONLY)
Anti-Cardiolipin Ab, IgA (RDL): <12 U/mL (ref <12)
Anti-Cardiolipin Ab, IgG (RDL): <15 GPL U/mL (ref <15)
Anti-Cardiolipin Ab, IgM (RDL): <13 [MPL'U]/mL (ref <13)
Anti-Centromere Ab (RDL): <1:40 {titer}
Anti-Chromatin Ab, IgG: <20 U (ref <20)
Anti-La (SS-B) Ab (RDL): <20 U (ref <20)
Anti-Ro (SS-A) Ab (RDL): <20 U (ref <20)
Anti-Scl-70 Ab (RDL): <20 U (ref <20)
Anti-Sm Ab (RDL): <20 U (ref <20)
Anti-TPO Ab (RDL): <9 [IU]/mL (ref <9.0)
Anti-dsDNA Ab by Farr (RDL): <8 [IU]/mL (ref <8.0)
C3 Complement (RDL): 191 mg/dL — ABNORMAL HIGH (ref 82–167)
C4 Complement (RDL): 32 mg/dL (ref 14–44)
CCP IgG/IgA Abs: <20 U (ref <20)
RA Latex Turbid: <14 [IU]/mL (ref <14)
Speckled Pattern: 1:80 {titer} — ABNORMAL HIGH
U1RNP Ab: <20 U (ref <20)

## 2023-02-10 LAB — ANA (ANTINUCLEAR ANTIBODIES): Anti-Nuclear Ab by IFA (RDL): POSITIVE — AB

## 2023-03-07 MED FILL — PHENYTOIN SODIUM EXTENDED 100 MG CAPSULE: 100 100 mg | ORAL | 30 days supply | Qty: 120 | Fill #3 | Status: CP

## 2023-03-08 LAB — BASIC METABOLIC PANEL
Anion Gap: 15 mmol/L (ref 10.0–18.0)
BUN/Creat Ratio: 13 (ref 9–20)
BUN: 12 mg/dL (ref 6–20)
Calcium: 9.6 mg/dL (ref 8.7–10.2)
Carbon Dioxide: 24 mmol/L (ref 20–29)
Chloride: 101 mmol/L (ref 96–106)
Creat: 0.9 mg/dL (ref 0.76–1.27)
Glucose: 83 mg/dL (ref 70–99)
Potassium: 4.5 mmol/L (ref 3.5–5.2)
Sodium: 140 mmol/L (ref 134–144)
eGFR: 118 mL/min/{1.73_m2} (ref >59)

## 2023-03-08 LAB — CBC W/DIFF
Baso Abs: 0 10*3/uL (ref 0.0–0.2)
Basos: 1 %
Eos Abs: 0.2 10*3/uL (ref 0.0–0.4)
Eos: 3 %
Hct: 47.7 % (ref 37.5–51.0)
Hgb: 15.8 g/dL (ref 13.0–17.7)
Immature Grans Abs: 0 10*3/uL (ref 0.0–0.1)
Immature Granulocytes: 0 %
Lymphs Abs: 2 10*3/uL (ref 0.7–3.1)
Lymphs: 32 %
MCH: 29 pg (ref 26.6–33.0)
MCHC: 33.1 g/dL (ref 31.5–35.7)
MCV: 88 fL (ref 79–97)
Monocytes: 10 %
MonocytesAbs: 0.6 10*3/uL (ref 0.1–0.9)
Neutrophils Abs: 3.5 10*3/uL (ref 1.4–7.0)
Neutrophils: 54 %
Platelets: 350 10*3/uL (ref 150–450)
RBC: 5.44 x10E6/uL (ref 4.14–5.80)
RDW: 12.4 % (ref 11.6–15.4)
WBC: 6.3 10*3/uL (ref 3.4–10.8)

## 2023-03-08 LAB — HEPATITIS C ANTIBODY: Hep C Virus Ab: NONREACTIVE

## 2023-03-08 LAB — HEMOGLOBIN A1C: HgbA1C: 5.3 % (ref 4.8–5.6)

## 2023-03-08 LAB — LIPID PANEL
Cholesterol: 192 mg/dL (ref 100–199)
HDL Cholesterol: 51 mg/dL (ref >39)
LDLc Calc (NIH): 111 mg/dL — ABNORMAL HIGH (ref 0–99)
Non-HDL Chol: 141 mg/dL — ABNORMAL HIGH (ref 0–129)
Triglycerides: 170 mg/dL — ABNORMAL HIGH (ref 0–149)
VLDLc Calc: 30 mg/dL (ref 5–40)

## 2023-03-08 LAB — TSH WITH REFLEX: TSH: 1.1 u[IU]/mL (ref 0.450–4.500)

## 2023-04-08 MED FILL — PHENYTOIN SODIUM EXTENDED 100 MG CAPSULE: 100 100 mg | ORAL | 15 days supply | Qty: 60 | Fill #4 | Status: CP

## 2023-04-08 MED FILL — PHENYTOIN SODIUM EXTENDED 100 MG CAPSULE: 100 100 mg | ORAL | 15 days supply | Qty: 60 | Fill #5 | Status: CP

## 2023-05-09 MED FILL — PHENYTOIN SODIUM EXTENDED 100 MG CAPSULE: 100 100 mg | ORAL | 30 days supply | Qty: 120 | Fill #6 | Status: CP

## 2023-06-07 MED FILL — PHENYTOIN SODIUM EXTENDED 100 MG CAPSULE: 100 100 mg | ORAL | 30 days supply | Qty: 120 | Fill #7 | Status: CP

## 2023-06-25 ENCOUNTER — Ambulatory Visit: Payer: BLUE CROSS/BLUE SHIELD | Attending: Neurology | Primary: Internal Medicine

## 2023-07-02 ENCOUNTER — Ambulatory Visit
Admit: 2023-07-02 | Discharge: 2023-07-02 | Payer: BLUE CROSS/BLUE SHIELD | Attending: Neurology | Primary: Internal Medicine

## 2023-07-02 DIAGNOSIS — G40909 Epilepsy, unspecified, not intractable, without status epilepticus: Secondary | ICD-10-CM

## 2023-07-02 NOTE — Progress Notes (Signed)
[facility]  [facility]  [address]  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 Pht Neuropathy    Subjective   HPI     As above, see prior notes, numbness to dorsum of feet bilaterally        We reviewed case- seizure/safety/RMV issues    He is thinking long term how to proceed and change trajectory of ? 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  Additionally having been on Dilantin for approximately a 10-year.  He is worried about having a Dilantin neuropathy which could manifest as an axonal neuropathy, he has some painful numbness in the soles of his feet.  He had a extensive EMG including screen for tarsal tunnel syndrome as well as plantar nerves which were robustly normal as are his sensory nerve action potentials in the lower extremities, these are not even borderline they are squarely within normal limits.  The patient is doing well although he has his baseline sensory complaint.  The patient is doing well in general     Course of appointment: I reviewed his case with the neuromuscular team at the last visit.  They would like to obtain further laboratory serologies which I have ordered, if all is negative then he can have an evaluation for small fiber neuropathy, we cannot fully exclude an independent process other than the Dilantin contributing to this time as there is no definitely pathognomonic signature features from any testing including EMG which is robustly normal. 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  Neuromuscular team wanted to make sure that we had C-spine neuroimaging which we did which does not explain the symptoms. He would none the less like to see neuromuscular team and I re iterated that I agree with that      Past Medical History  He has a past medical history of Seizure (Multi-HCC).     Surgical History  He has no past surgical history on file.     Social History  He reports that he has never smoked. He has never used smokeless tobacco. He reports that he does not drink alcohol and does not use drugs.     Family History  Family History          Family History   Problem Relation Name Age of Onset    Other (HTN) Father                Advance Care Plan  Extended Emergency Contact Information  Primary Emergency Contact: Mattox,[place]  Mobile Phone: [phone]  Relation: Spouse  Interpreter needed? No  No Order           Allergies  Amoxicillin     Medications     Current Outpatient Medications:     diazePAM (Valtoco) 10 mg/spray (0.1 mL), Administer 2 sprays (10 mg) into affected nostril(s) if needed (PRN prolonged seizure > 5 minutes)., Disp: 2 each,  Rfl: 1    phenytoin ER (Dilantin KapseaL) 100 mg capsule, Take 2 capsules by mouth 2 times daily, Disp: 120 capsule, Rfl: 11         Objective   There were no vitals taken for this visit.    Physical Exam  Limited on video but appears well + baseline    Assessment/Plan   There are no diagnoses linked to this encounter.    A/P:   As above- seizure disorder, sensory changes, ? 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 Two patient identifiers were used and confirmed. Physical location of the patient: home. Patient resides in: [place]  Physical location of the provider: office. Other  participants/involvement: none  Total minutes spent: 36 with chart review / admin/ brief telemed visit

## 2023-07-05 NOTE — Telephone Encounter (Signed)
-----   Message from [place] sent at 07/05/2023  1:35 PM EST -----  Regarding: FW: let's have him see specifically Dr Iran Planas- ? Dilantin neuropathy or other thx  He's already scheduled and on the cancellation list.  ----- Message -----  From: Lewis Moccasin, MD  Sent: 07/02/2023   6:21 PM EST  To: Marko Stai  Subject: let's have him see specifically Dr Iran Planas- ? D#

## 2023-07-09 ENCOUNTER — Ambulatory Visit
Admit: 2023-07-09 | Discharge: 2023-07-09 | Payer: BLUE CROSS/BLUE SHIELD | Attending: Neurology | Primary: Internal Medicine

## 2023-07-09 DIAGNOSIS — R202 Paresthesia of skin: Secondary | ICD-10-CM

## 2023-07-09 DIAGNOSIS — R200 Anesthesia of skin: Secondary | ICD-10-CM

## 2023-07-09 MED FILL — PHENYTOIN SODIUM EXTENDED 100 MG CAPSULE: 100 100 mg | ORAL | 30 days supply | Qty: 120 | Fill #8 | Status: CP

## 2023-07-09 NOTE — Progress Notes (Signed)
[facility]  [address]  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 This is a 33 year-old male seen today 07/09/23 for evaluation of neuropathic foot symptoms.  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    History of the present illness    He endorses a history of 4-year of neuropathic symptoms.  He started initial affecting his right toes, few months later it became contralateral and since then has been quite symmetrical.  Over the next 2 years the symptoms extended from the toes to affect the bottom of both feet, distal half of the foot.  There is no involvement of the dorsum of the foot.  The sensation pensive is dull numbness and pins-and-needles.  There is no sensitivity to touch or spontaneous or induced pain.  There is no burning sensation, no abnormal sensation such as pressure.  No color changes.  There are no symptoms in the hands or fingertips, a few months ago he had minimal tingling in the fingertips that went away spontaneously.  Denies any motor symptoms.  Autonomic review of systems is strictly negative.  He endorses mild progression in intensity over the last few years.  Denies excessive alcohol intake.    There is no family history of neuropathy.    He has a longstanding seizure disorder, primary generalized epilepsy apparently, 10 years using Dilantin at a dose of 400 mg daily.  Prior to that he felt levetiracetam, Vimpat, or Depakote.  Dilantin has been the only anticonvulsant able to control his seizures.    An EMG last July was normal.      C-spine MRI without contrast was negative.    Past results    (02/20/2022) MR C-spine without  contrast  IMPRESSION:  Limited study due to significant motion artifact. Mild degenerative changes of the cervical spine in particular at C6-C7 with mild to moderate bilateral neural foraminal narrowing. No definite canal stenosis.    (01/02/2023) EMG [place]   Findings:   1. Right superficial radial and bilateral sural antidromic sensory nerve conduction studies were normal.   2. Bilateral medial and lateral plantar mixed nerve conduction studies were normal.   3. Right tibial AH motor conduction studies, including F responses, were normal.   4. Bilateral tibial H-reflexes were normal.   5. Concentric needle examination of muscles representative of the left lumbosacral myotomes was normal.   Conclusion:   This is a normal study. Specifically, there is no neurophysiologic evidence of a large fiber peripheral neuropathy, a distal tibial neuropathy on either side, or a left lumbosacral radiculopathy.   Standard nerve conduction studies are not sensitive for the detection of small fiber neuropathy.     (03/07/2023) CBC, BMP, lipid panel, A1c, TSH, hepatitis C    (01/29/2023) ANA 1/80, speckled. LFTs, iron metabolism, cryoglobulin, SPEP, cardiolipins, TPO, Centromere, CCP, dsDNA, RF, SCL 70, Smith, SSA, SSB, Jo 1 RNP, C3, C4, all normal or negative.  HIV negative.    Past Medical History:   Diagnosis Date  Seizure (Multi-HCC)        No past surgical history on file.    Current Outpatient Medications on File Prior to Visit   Medication Sig Dispense Refill    diazePAM (Valtoco) 10 mg/spray (0.1 mL) Administer 2 sprays (10 mg) into affected nostril(s) if needed (PRN prolonged seizure > 5 minutes). 2 each 1    phenytoin ER (Dilantin KapseaL) 100 mg capsule Take 2 capsules by mouth 2 times daily 120 capsule 11     No current facility-administered medications on file prior to visit.       Allergies   Allergen Reactions    Amoxicillin Itching       Social History     Tobacco Use    Smoking status: Never    Smokeless tobacco:  Never   Substance Use Topics    Alcohol use: Never       Family History   Problem Relation Name Age of Onset    Other (HTN) Father         ROS     Physical examination    Vitals:    07/09/23 1550   BP: 132/73   Pulse: 80   SpO2: 100%     GEN: NAD, pleasant, cooperative.  HEENT: Atraumatic, normocephalic. Mucous membranes moist.  Skin: No rashes or lesions.  Extremities: No bruises or edema. No contractures, normal ROM.  No foot deformities or discoloration.    NEUROLOGICAL EXAMINATION:    Mental Status: Alert and oriented x3. Language is fluent with good comprehension. No dysarthria.     Cranial Nerve: Pupils are equal, round, and reactive to light. Visual fields are full to confrontation. EOM intact. Face is symmetric at rest and with activation with intact sensation throughout. Palate activate symmetrically. Strength is full in sternocleidomastoid and trapezius bilaterally. Normal tongue.     Motor: Strength is 5/5 in all four extremities both proximally and distally. Intact fine motor movements bilaterally. Muscle bulk and tone are normal. No pronator drift.    Sensory: Sensation is intact to light touch, pinprick, joint position.  There is minimal comparative loss of vibration in toes.       Reflexes: 2+ throughout. Plantar responses: Flexor bilaterally.    Coordination: No dysmetria on finger-nose-finger and heel-to-shin tests bilaterally. No dysdiadochokinesia.    Gait: Narrow-based with normal stride length and symmetric arm swing bilaterally. Able to walk in tandem. No Romberg sign.          Assessment and plan    This is a 33 year-old male seen in the clinic for neuropathic symptoms in both feet, chronic longstanding, slightly progressive.  He has been exposed for about 10 years to phenytoin.  Phenytoin is a known neurotoxic agent, causative of peripheral neuropathy with protracted use.  Serum levels need not be necessarily in toxic levels, and there is variability in duration and dosing of exposure.  An  extensive workup is negative suggesting potential relation to this agent. Nonetheless, at the present there is no evidence of neuropathy with ancillary testing (nerve conduction studies are normal).  There is suggestion of loss of vibratory perception.  I would like to validate this finding with QST, which may also serve as baseline.  Alternatively, there is a small but nonzero possibility that his neuropathic deficits are related to central, dorsal column pathology.  His C-spine is negative but the thoracic spine has not been examined, which I will request. To complete the workup I added additional labs to screen for disorders that  may be associated with peripheral neuropathy.    Will discuss results via portal/phone.    FU PRN for now.    Problem List Items Addressed This Visit             ICD-10-CM       Nervous    Numbness and tingling - Primary R20.0, R20.2    Relevant Orders    Vitamin B12 with reflex to MMA    Free Light Chains (Free Kappa, Free Lambda)    Immunofixation electrophoresis    Neurology Misc Order    MR THORACIC SPINE W AND WO CONTRAST       Follow up if symptoms worsen or fail to improve.        Sincerely,    Ginger Organ, MD  Attending Neurologist    Cc:  Dala Dock, MD  Dala Dock, MD    Voice recognition software was used to generate this note. Please excuse any typographical errors or word substitution.    Thank you for letting me participate in this patient's care.    Total of 70 minutes were spent preparing for and during this encounter including: review of imaging, performing medically appropriate examination, counseling/education regarding treatment plans and clinical documentation into the electronic health record.

## 2023-07-11 LAB — IMMUNOFIXATION ELECTROPHORESIS, SERUM
IgA: 206 mg/dL (ref 90–386)
IgG: 1035 mg/dL (ref 603–1613)
IgM: 41 mg/dL (ref 20–172)

## 2023-07-11 LAB — FREE LIGHT CHAINS (KAPPA, LAMBDA)
Fr Kappa Lt Chains: 12.8 mg/L (ref 3.3–19.4)
Fr Lambda Lt Chains: 10.7 mg/L (ref 5.7–26.3)
Kappa/Lambda Ratio: 1.2 (ref 0.26–1.65)

## 2023-07-16 LAB — METHYLMALONIC ACID, SERUM: Methylmalonic Acid: 859 nmol/L — ABNORMAL HIGH (ref 0–378)

## 2023-07-16 LAB — VITAMIN B12 WITH REFLEX TO MMA: Vitamin B12: 261 pg/mL (ref 232–1245)

## 2023-07-17 MED ORDER — cyanocobalamin (Vitamin B-12) 1,000 mcg tablet
1000 | ORAL_TABLET | Freq: Every day | ORAL | 2 refills | 30.00000 days | Status: AC
Start: 2023-07-17 — End: 2023-11-06
  Filled 2023-07-17: qty 30, 30d supply, fill #0

## 2023-07-17 MED ORDER — folic acid (Folvite) 1 mg tablet
1 | ORAL_TABLET | Freq: Every day | ORAL | 2 refills | 30.00000 days | Status: AC
Start: 2023-07-17 — End: 2023-11-06
  Filled 2023-07-17: qty 30, 30d supply, fill #0

## 2023-07-29 ENCOUNTER — Inpatient Hospital Stay: Admit: 2023-07-29 | Payer: BLUE CROSS/BLUE SHIELD | Primary: Internal Medicine

## 2023-07-29 DIAGNOSIS — R202 Paresthesia of skin: Secondary | ICD-10-CM

## 2023-07-29 DIAGNOSIS — R200 Anesthesia of skin: Principal | ICD-10-CM

## 2023-07-29 MED ORDER — gadoterate meglumine (Dotarem/Clariscan) 0.5 mmol/mL injection 20 mL
0.5 | Freq: Once | INTRAVENOUS | Status: AC
Start: 2023-07-29 — End: 2023-07-29
  Administered 2023-07-29: 14:00:00 20 mL via INTRAVENOUS

## 2023-08-08 MED FILL — PHENYTOIN SODIUM EXTENDED 100 MG CAPSULE: 100 100 mg | ORAL | 30 days supply | Qty: 120 | Fill #9 | Status: CP

## 2023-08-10 ENCOUNTER — Inpatient Hospital Stay
Admit: 2023-08-10 | Discharge: 2023-08-10 | Disposition: A | Discharge status: Home / Self Care | Payer: Worker's Compensation | Attending: Emergency Medicine

## 2023-08-10 DIAGNOSIS — Z578 Occupational exposure to other risk factors: Secondary | ICD-10-CM

## 2023-08-10 DIAGNOSIS — Z7721 Contact with and (suspected) exposure to potentially hazardous body fluids: Secondary | ICD-10-CM

## 2023-08-10 DIAGNOSIS — Z7722 Contact with and (suspected) exposure to environmental tobacco smoke (acute) (chronic): Secondary | ICD-10-CM

## 2023-08-10 LAB — ALT: ALT: 44 U/L (ref 0–55)

## 2023-08-10 LAB — HIV AB/AG SCREEN: HIV-1/2 Ab/Ag, P24 Ag screen: NONREACTIVE

## 2023-08-10 NOTE — ED Provider Notes (Signed)
 [facility] EMERGENCY DEPARTMENT  [address]  [place] [address]        HPI   Chief Complaint   Patient presents with    Body Fluid Exposure        The patient reports that he was stuck with a needle used during a medical procedure.  The patient notes that the needle was used to insert a central line and the needle fell from another clinicians grasp patient reflexively tried to grab it and felt the stick in the right thumb.  The patient notes no bleeding after this injury but did note that the thumb was not watertight when he tested with an apparent hole in the area where the needle was.  The patient is previously in good health and reports he is up-to-date on all immunizations.      Source patient data: Wesley Parker MRN: [id number]        Patient History   Past Medical History:   Diagnosis Date    Seizure (Multi-HCC)      No past surgical history on file.  Family History   Problem Relation Name Age of Onset    Other (HTN) Father       Social History     Tobacco Use    Smoking status: Never    Smokeless tobacco: Never   Substance Use Topics    Alcohol use: Never    Drug use: Never       Review of Systems   Review of Systems   Constitutional: Negative.    HENT: Negative.     Eyes: Negative.    Cardiovascular: Negative.    Gastrointestinal: Negative.    Endocrine: Negative.    Genitourinary: Negative.    Musculoskeletal: Negative.    Skin: Negative.    Allergic/Immunologic: Negative.    Neurological: Negative.    Psychiatric/Behavioral: Negative.         Physical Exam   ED Triage Vitals [08/10/23 1612]   Temp Pulse Resp BP   36.4 C (97.5 F) 101 20 135/67      SpO2 Temp Source Heart Rate Source Patient Position   96 % Oral -- --      BP Location FiO2 (%)     -- --       Physical Exam  Vitals and nursing note reviewed.     The patient is a well-developed, well-nourished, male.  He is alert and nontoxic-appearing.  The voice is normal.  The respirations are even unlabored.  The patient's  mentation is lucid, he follows commands but he moves all 4 extremities.  The speech is fluent.  The cranial nerves are intact.  The gait is steady.  No puncture wounds or lacerations are noted on the right hand/thumb.    No orders to display       Labs Reviewed - No data to display    Glasgow Coma Scale Score: 15      ED Course & MDM        Medical Decision Making          MEDICAL DECISION MAKING  Nursing notes reviewed and agreed with.    EXTERNAL RECORDS REVIEWED  I reviewed the patient's visits listed in the epic database    INDEPENDENT INTERPRETATIONS  N/A    TESTS/TREATMENT/HOSPITALIZATION CONSIDERED  HIV, hep B surface antibody, hepatitis C screen, ALT    CHRONIC CONDITIONS  Epilepsy    ACUTE CONDITIONS  Body fluid exposure    MEDICATIONS  DISCUSSION WITH OTHER PROVIDERS  The patient's case was discussed with infectious disease/needlestick    SOCIAL DETERMINATES OF HEALTH  None    INFORMANTS  Patient     The patient's case was discussed with the needlestick coverage, PEP was not recommended.    Diagnosis: Body fluid exposure  Disposition: Discharged             Genene Churn. Jeannett Senior, MD  08/10/23 [phone]

## 2023-08-10 NOTE — Unmapped (Signed)
 [facility] - Emergency Department  PROVIDER IN TRIAGE NOTE    PT is a 33 y.o. male PMH seizure who presents with needlestick injury to the right thumb.    Reports a coworker dropped a needle and he reflexively tried to catch it and stuck his right thumb. Reports it broke his gloves but didn't bleed.    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    TRIAGE VITALS    ED Triage Vitals [08/10/23 1612]   Temp Pulse Resp BP   36.4 C (97.5 F) 101 20 135/67      SpO2 Temp Source Heart Rate Source Patient Position   96 % Oral -- --      BP Location FiO2 (%)     -- --          PE:   Adult in no acute distress  Speaking full sentences  Ambulatory with steady gait  Moves all extremities equally  No facial droop    Triage MDM/Plan   PT is a 33 y.o. male who presents with needlestick injury.  Will likely proceed with Needlestick injury.  Please refer to the ED provider's note for further detailed plan and disposition.    Medical screening evaluation was performed in triage and workup was initiated. Patient was advised further evaluation and treatment may be required and advised not to leave the Emergency Department until completed.     Delorse Lek, [place]  08/10/23 [phone]

## 2023-08-10 NOTE — Discharge Instructions (Signed)
 You were seen in the emergency department with potential exposure to body fluids.  Your case was reviewed with infectious disease, baseline testing was done, postexposure prophylaxis for HIV was not recommended.  Please follow-up with employee health on Monday.  Return if any problems.

## 2023-08-10 NOTE — ED Triage Notes (Signed)
 Pt arrives awake and alert, self ambulatory with steady gait, speaking in full sentences with ease.   Pt c/o R thumb needle stick while working. Pt tried to grab a falling needle being used for a central line. Pt reports charge nurse contacted nursing supervisor and pt reports he contacted the needle stick fellow.

## 2023-08-11 LAB — HEPATITIS B SURFACE ANTIBODY QUANT
Hepatitis B surface antibody conc: 6 m[IU]/mL
Hepatitis B surface antibody: NONREACTIVE

## 2023-08-12 LAB — HCV AB W/REFLEX QUANT PCR (SCREENING): Hepatitis C antibody: NONREACTIVE

## 2023-08-13 ENCOUNTER — Inpatient Hospital Stay: Admit: 2023-08-13 | Discharge: 2023-08-13 | Payer: BLUE CROSS/BLUE SHIELD | Primary: Internal Medicine

## 2023-08-13 DIAGNOSIS — R202 Paresthesia of skin: Secondary | ICD-10-CM

## 2023-08-29 MED FILL — CYANOCOBALAMIN (VIT B-12) 1,000 MCG TABLET: 1000 1,000 mcg | ORAL | 30 days supply | Qty: 30 | Fill #1 | Status: CP

## 2023-08-29 MED FILL — FOLIC ACID 1 MG TABLET: 1 1 mg | ORAL | 30 days supply | Qty: 30 | Fill #1 | Status: CP

## 2023-09-07 MED FILL — PHENYTOIN SODIUM EXTENDED 100 MG CAPSULE: 100 100 mg | ORAL | 30 days supply | Qty: 120 | Fill #10 | Status: CP

## 2023-10-07 MED FILL — FOLIC ACID 1 MG TABLET: 1 1 mg | ORAL | 30 days supply | Qty: 30 | Fill #2 | Status: CP

## 2023-10-07 MED FILL — PHENYTOIN SODIUM EXTENDED 100 MG CAPSULE: 100 100 mg | ORAL | 30 days supply | Qty: 120 | Fill #11 | Status: CP

## 2023-10-07 MED FILL — CYANOCOBALAMIN (VIT B-12) 1,000 MCG TABLET: 1000 1,000 mcg | ORAL | 30 days supply | Qty: 30 | Fill #2 | Status: CP

## 2023-10-21 ENCOUNTER — Ambulatory Visit: Payer: BLUE CROSS/BLUE SHIELD | Attending: Neurology | Primary: Internal Medicine

## 2023-10-29 ENCOUNTER — Ambulatory Visit: Payer: BLUE CROSS/BLUE SHIELD | Attending: Neurology | Primary: Internal Medicine

## 2023-10-30 LAB — FOLATE: Folate: 5 ng/mL (ref >3.0)

## 2023-10-30 LAB — IRON, FERRITIN, TIBC
Ferritin: 95 ng/mL (ref 30–400)
Iron Bind.Cap.(TIBC): 289 ug/dL (ref 250–450)
Iron Saturation: 21 % (ref 15–55)
Iron: 61 ug/dL (ref 38–169)
UIBC: 228 ug/dL (ref 111–343)

## 2023-10-30 LAB — PHENYTOIN LEVEL, TOTAL: Phenytoin Lvl: 11.5 ug/mL (ref 10.0–20.0)

## 2023-10-30 LAB — HOMOCYSTEINE: Homocyst(e)ine: 12.8 umol/L (ref 0.0–14.5)

## 2023-10-30 MED ORDER — phenytoin ER (Dilantin KapseaL) 100 mg capsule
100 | ORAL_CAPSULE | ORAL | 11 refills | 43.00000 days | Status: AC
Start: 2023-10-30 — End: ?
  Filled 2023-10-30: qty 150, 30d supply, fill #0

## 2023-11-30 LAB — PHENYTOIN LEVEL, TOTAL: Phenytoin Lvl: 18.5 ug/mL (ref 10.0–20.0)

## 2023-11-30 LAB — URINE DRUG SCREEN
Amphet Scr Ur: NEGATIVE ng/mL
Barbiturates screen, urine: NEGATIVE ng/mL
Benzodiazepines Screen, Ur: NEGATIVE ng/mL
Buprenorphine Screen, urine: NEGATIVE ng/mL
Cannabinoids screen, urine: NEGATIVE ng/mL
Cocaine screen, urine: NEGATIVE ng/mL
Creatinine, Urine: 15.7 mg/dL — ABNORMAL LOW (ref 20.0–300.0)
Ethanol Screen, Ur: NEGATIVE %
Fentanyl Screen, Ur: NEGATIVE ng/mL
Methadone Screen, Ur: NEGATIVE ng/mL
Nitrite, Urine: NEGATIVE ug/mL
Opiates screen, urine: NEGATIVE ng/mL
Oxycodone/Oxymorphone Screen, Ur: NEGATIVE ng/mL
pH, Urine: 6.5 (ref 4.5–8.9)

## 2023-11-30 LAB — HEPATITIS B SURFACE ANTIBODY QUANT: Hep B Surf Ab Qn: 6.7 m[IU]/mL — ABNORMAL LOW

## 2023-11-30 LAB — SPECIFIC GRAVITY (REFLEX ONLY): Specific Gravity, Urine: 1.003

## 2023-11-30 LAB — PLEASE NOTE (REFLEX ONLY)

## 2023-12-04 LAB — QUANTIFERON(R)-TB GOLD PLUS, 4T: QFT-TB Gold Plus: NEGATIVE

## 2023-12-04 LAB — QFT-TB GOLD PLUS (REFLEX ONLY)
QFT Mitogen Value: >10 [IU]/mL
QFT Nil Value: 0.03 [IU]/mL
QFT TB1 Ag Value: 0.07 [IU]/mL
QFT TB2 Ag Value: 0.06 [IU]/mL

## 2023-12-04 MED ORDER — cyanocobalamin (Vitamin B-12) 1,000 mcg tablet
1000 | ORAL_TABLET | Freq: Every day | ORAL | 2 refills | 30.00000 days | Status: AC
Start: 2023-12-04 — End: 2024-03-04
  Filled 2023-12-04: qty 30, 30d supply, fill #0

## 2023-12-04 MED FILL — PHENYTOIN SODIUM EXTENDED 100 MG CAPSULE: 100 100 mg | ORAL | 30 days supply | Qty: 150 | Fill #1 | Status: CP

## 2023-12-12 LAB — ALT: ALT: 42 U/L (ref 0–55)

## 2023-12-13 LAB — HEPATITIS B SURFACE ANTIGEN: Hepatitis B surface antigen: NONREACTIVE

## 2023-12-13 LAB — HCV AB W/REFLEX QUANT PCR (SCREENING): Hepatitis C antibody: NONREACTIVE

## 2023-12-13 LAB — HEPATITIS B CORE ANTIBODY, TOTAL: Hepatitis B core antibody, total: NONREACTIVE

## 2023-12-30 MED FILL — CYANOCOBALAMIN (VIT B-12) 1,000 MCG TABLET: 1000 1,000 mcg | ORAL | 30 days supply | Qty: 30 | Fill #1 | Status: CP

## 2023-12-30 MED FILL — PHENYTOIN SODIUM EXTENDED 100 MG CAPSULE: 100 100 mg | ORAL | 30 days supply | Qty: 150 | Fill #2 | Status: CP

## 2024-01-29 MED FILL — CYANOCOBALAMIN (VIT B-12) 1,000 MCG TABLET: 1000 1,000 mcg | ORAL | 30 days supply | Qty: 30 | Fill #2 | Status: CP

## 2024-01-29 MED FILL — PHENYTOIN SODIUM EXTENDED 100 MG CAPSULE: 100 100 mg | ORAL | 30 days supply | Qty: 150 | Fill #3 | Status: CP

## 2024-01-31 ENCOUNTER — Ambulatory Visit: Payer: BLUE CROSS/BLUE SHIELD | Attending: Internal Medicine | Primary: Internal Medicine

## 2024-04-07 ENCOUNTER — Ambulatory Visit: Payer: BLUE CROSS/BLUE SHIELD | Attending: Internal Medicine | Primary: Internal Medicine

## 2024-06-24 NOTE — Telephone Encounter (Signed)
 Pls see above. txhs
